# Patient Record
Sex: Female | Born: 1950 | Race: White | Hispanic: No | Marital: Single | Smoking: Never smoker
Health system: Southern US, Community
[De-identification: ages and names within clinical notes are randomized; demographics above are authoritative.]

## PROBLEM LIST (undated history)

## (undated) DIAGNOSIS — S42302A Unspecified fracture of shaft of humerus, left arm, initial encounter for closed fracture: Secondary | ICD-10-CM

## (undated) DIAGNOSIS — H547 Unspecified visual loss: Secondary | ICD-10-CM

## (undated) DIAGNOSIS — I10 Essential (primary) hypertension: Secondary | ICD-10-CM

## (undated) DIAGNOSIS — H269 Unspecified cataract: Secondary | ICD-10-CM

## (undated) DIAGNOSIS — E119 Type 2 diabetes mellitus without complications: Secondary | ICD-10-CM

## (undated) DIAGNOSIS — H409 Unspecified glaucoma: Secondary | ICD-10-CM

## (undated) DIAGNOSIS — Z9189 Other specified personal risk factors, not elsewhere classified: Secondary | ICD-10-CM

---
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
## 2013-01-24 ENCOUNTER — Emergency Department (HOSPITAL_COMMUNITY): Payer: Self-pay

## 2013-01-24 ENCOUNTER — Emergency Department (INDEPENDENT_AMBULATORY_CARE_PROVIDER_SITE_OTHER)
Admission: EM | Admit: 2013-01-24 | Discharge: 2013-01-24 | Disposition: A | Discharge status: Nursing Home (Includes ICF) | Payer: Self-pay | Source: Home / Self Care | Attending: Emergency Medicine | Admitting: Emergency Medicine

## 2013-01-24 ENCOUNTER — Emergency Department (HOSPITAL_COMMUNITY)
Admission: EM | Admit: 2013-01-24 | Discharge: 2013-01-25 | Disposition: A | Discharge status: Home / Self Care | Payer: Self-pay | Attending: Emergency Medicine | Admitting: Emergency Medicine

## 2013-01-24 ENCOUNTER — Encounter (HOSPITAL_COMMUNITY): Payer: Self-pay | Admitting: Emergency Medicine

## 2013-01-24 ENCOUNTER — Encounter (HOSPITAL_COMMUNITY): Payer: Self-pay | Admitting: *Deleted

## 2013-01-24 DIAGNOSIS — H5712 Ocular pain, left eye: Secondary | ICD-10-CM

## 2013-01-24 DIAGNOSIS — H5710 Ocular pain, unspecified eye: Secondary | ICD-10-CM

## 2013-01-24 DIAGNOSIS — I10 Essential (primary) hypertension: Secondary | ICD-10-CM | POA: Insufficient documentation

## 2013-01-24 DIAGNOSIS — R51 Headache: Secondary | ICD-10-CM

## 2013-01-24 DIAGNOSIS — R7309 Other abnormal glucose: Secondary | ICD-10-CM | POA: Insufficient documentation

## 2013-01-24 DIAGNOSIS — H547 Unspecified visual loss: Secondary | ICD-10-CM | POA: Insufficient documentation

## 2013-01-24 HISTORY — DX: Essential (primary) hypertension: I10

## 2013-01-24 HISTORY — DX: Type 2 diabetes mellitus without complications: E11.9

## 2013-01-24 LAB — DIFFERENTIAL
Basophils Absolute: 0 10*3/uL (ref 0.0–0.1)
Lymphocytes Relative: 16 % (ref 12–46)
Lymphs Abs: 1.2 10*3/uL (ref 0.7–4.0)
Monocytes Absolute: 0.7 10*3/uL (ref 0.1–1.0)
Neutro Abs: 5.5 10*3/uL (ref 1.7–7.7)

## 2013-01-24 LAB — POCT I-STAT, CHEM 8
BUN: 17 mg/dL (ref 6–23)
Calcium, Ion: 1.16 mmol/L (ref 1.13–1.30)
Creatinine, Ser: 1 mg/dL (ref 0.50–1.10)
Glucose, Bld: 283 mg/dL — ABNORMAL HIGH (ref 70–99)
TCO2: 25 mmol/L (ref 0–100)

## 2013-01-24 LAB — COMPREHENSIVE METABOLIC PANEL
ALT: 16 U/L (ref 0–35)
AST: 16 U/L (ref 0–37)
CO2: 23 mEq/L (ref 19–32)
Chloride: 99 mEq/L (ref 96–112)
GFR calc Af Amer: >90 mL/min (ref >90)
GFR calc non Af Amer: 88 mL/min — ABNORMAL LOW (ref >90)
Glucose, Bld: 285 mg/dL — ABNORMAL HIGH (ref 70–99)
Sodium: 134 mEq/L — ABNORMAL LOW (ref 135–145)
Total Bilirubin: 0.7 mg/dL (ref 0.3–1.2)

## 2013-01-24 LAB — CBC
HCT: 34.7 % — ABNORMAL LOW (ref 36.0–46.0)
MCV: 82.2 fL (ref 78.0–100.0)
Platelets: 307 10*3/uL (ref 150–400)
RBC: 4.22 MIL/uL (ref 3.87–5.11)
RDW: 13.1 % (ref 11.5–15.5)
WBC: 7.6 10*3/uL (ref 4.0–10.5)

## 2013-01-24 LAB — APTT: aPTT: 34 seconds (ref 24–37)

## 2013-01-24 LAB — POCT I-STAT TROPONIN I

## 2013-01-24 NOTE — ED Notes (Addendum)
C/o pain in L eye, like a severe migraine, onset with 8/16.  Pain constant and increasing intensity.  Taking Ibuprofen.  Took Percocet today with partial relief but got nauseated.  She thinks she had a sinus infection  1-2 weeks ago.  Lost vision 1 year ago with pain in her R eye. 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  L eye vision decreasing over the past week.  She sees light and shadows- no detail.  Had smell coming from L ear- smell on Q tip for over 1 year.  No medical insurance, so she has not seen a doctor for it.  C/o dizziness from riding in car.  2 weeks ago she has had a hacking cough occasionally productive.

## 2013-01-24 NOTE — ED Notes (Signed)
PT. 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 HYPERTENSIVE AT TRIAGE .

## 2013-01-24 NOTE — ED Notes (Signed)
TRIAGE NURSE UPDATED ( EDP ) DR. Ranae Palms ON PT'S STATUS / HYPERTENSION - ORDERED STROKE PANEL ON PT.

## 2013-01-24 NOTE — ED Provider Notes (Signed)
CSN: [id number]     Arrival date & time 01/24/13  1920 History   First MD Initiated Contact with Patient 01/24/13 1937     Chief Complaint  Patient presents with  . Eye Pain  . Loss of Vision   (Consider location/radiation/quality/duration/timing/severity/associated sxs/prior Treatment) HPI Comments: Patient presents this evening to urgent care complaining of having a severe left-sided retro-ocular migraine type headache. ( Patient does not have a history of migraines nor she has been seen by any neurologist she reports since her arrival from [place]),. 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 She's been taking ibuprofen at home for her headaches, this afternoon she had to take some, Percocet for her severe headache. Patient does not have a primary care Dr. and denies having had any extensive her previous workups including a CT scan of her head. Prior to her arrival from [place]. Patient reports seeing flashes of lights and shadows on her left thigh. At this point she is only able to see changes in light very poorly defined shapes   She comes in tonight describing worsening headache retro-ocular pain and changes or worsening of her vision of her left eye. She also reports dizziness.  Son reports that patient has been having respiratory symptoms for the last 2 weeks which includes cough that becomes occasional productive. She is not a smoker and has never been diagnosed with COPD or or emphysema.   Patient is a 62 y.o. female presenting with eye pain. The history is provided by the patient.  Eye Pain This is a chronic problem. Associated symptoms include headaches. Pertinent negatives include no chest pain and no shortness of breath. Nothing aggravates the symptoms. Nothing relieves the symptoms.    Past Medical History  Diagnosis Date  . Hypertension   . Diabetes mellitus without complication    prediabetic   Past Surgical History  Procedure Laterality Date  . Cesarean section  1987  . Tonsillectomy  1967   History reviewed. No pertinent family history. History  Substance Use Topics  . Smoking status: Never Smoker   . Smokeless tobacco: Not on file  . Alcohol Use: No   OB History   Grav Para Term Preterm Abortions TAB SAB Ect Mult Living                 Review of Systems  Constitutional: Negative for activity change and appetite change.  Eyes: Positive for pain and visual disturbance. Negative for discharge.  Respiratory: Negative for cough and shortness of breath.   Cardiovascular: Negative for chest pain.  Musculoskeletal: Negative for myalgias.  Skin: Negative for color change.  Neurological: Positive for dizziness and headaches. Negative for tremors, seizures, syncope, facial asymmetry, speech difficulty, weakness and light-headedness.    Allergies  Bufferin low dose; Iodine; Sulfites; and Neosporin original  Home Medications   Current Outpatient Rx  Name  Route  Sig  Dispense  Refill  . ibuprofen (ADVIL,MOTRIN) 200 MG tablet   Oral   Take 200 mg by mouth every 6 (six) hours as needed for pain.         Marland Kitchen oxyCODONE-acetaminophen (PERCOCET/ROXICET) 5-325 MG per tablet   Oral   Take 1 tablet by mouth once.          BP 125/79  Pulse 80  Temp(Src) 98.2 F (36.8 C) (Oral)  Resp 20  SpO2 92% Physical Exam  Nursing note  and vitals reviewed. Constitutional: She is oriented to person, place, and time. No distress.  HENT:  Mouth/Throat: No oropharyngeal exudate.  Eyes: Right pupil is reactive. Left pupil is reactive.  Fundoscopic exam:      The right eye shows hemorrhage. The right eye shows red reflex.       The left eye shows hemorrhage. The left eye shows red reflex.  Pulmonary/Chest: No respiratory distress. She has wheezes. She has rales. She exhibits no tenderness.  Neurological: She is alert and oriented to person, place, and time. No cranial  nerve deficit. She exhibits normal muscle tone. Coordination normal.  Skin: No erythema.    ED Course  Procedures (including critical care time) Labs Review Labs Reviewed - No data to display Imaging Review No results found.  MDM   1. Headache   2. Ocular pain, left      Multi-symptomatic patient, includes severe headache, left worsening visual deficit, suboptimal pulse oxygenation and respiratory symptoms.   Patient reports severe unilateral headache associated with further changes in her left vision. Of note patient has not had as she reports any workups for her previous loss of vision of her right eye. Attributes her unilateral left retro-ocular severe headache to "sinusitis"   At this point would have a patient with several complaints and symptomatology which most remarkable is symptomatic patient report of severe unilateral headache associated with loss of vision. At this point I cannot rule out an intra-cranial tumor, complications of cerebrovascular subacute advanced, retinal detachments or corpus vitreous bleeding etc.... Patient needs a more extensive workup in the emergency department as patient is reporting active unilateral severe headache  Patient has also been having respiratory symptoms and her pulse oxygenation is suboptimal.     Jimmie Molly, MD 01/24/13 2019

## 2013-01-25 MED ORDER — DIPHENHYDRAMINE HCL 50 MG/ML IJ SOLN
25.0000 mg | Freq: Once | INTRAMUSCULAR | Status: AC
  Administered 2013-01-25: 25 mg via INTRAVENOUS
  Filled 2013-01-25: qty 1

## 2013-01-25 MED ORDER — METFORMIN HCL 500 MG PO TABS: 500.0000 mg | ORAL_TABLET | Freq: Two times a day (BID) | ORAL | Status: DC

## 2013-01-25 MED ORDER — METOCLOPRAMIDE HCL 5 MG/ML IJ SOLN
10.0000 mg | Freq: Once | INTRAMUSCULAR | Status: AC
  Administered 2013-01-25: 10 mg via INTRAVENOUS
  Filled 2013-01-25: qty 2

## 2013-01-25 MED ORDER — METHYLPREDNISOLONE SODIUM SUCC 125 MG IJ SOLR
125.0000 mg | Freq: Once | INTRAMUSCULAR | Status: AC
  Administered 2013-01-25: 125 mg via INTRAVENOUS
  Filled 2013-01-25: qty 2

## 2013-01-25 MED ORDER — SODIUM CHLORIDE 0.9 % IV SOLN
Freq: Once | INTRAVENOUS | Status: AC
  Administered 2013-01-25: 01:00:00 via INTRAVENOUS

## 2013-01-25 MED ORDER — HYDROCHLOROTHIAZIDE 12.5 MG PO TABS: 12.5000 mg | ORAL_TABLET | Freq: Every day | ORAL | Status: DC

## 2013-01-25 MED ORDER — PREDNISONE 10 MG PO TABS: 40.0000 mg | ORAL_TABLET | Freq: Every day | ORAL | Status: AC

## 2013-01-25 NOTE — ED Provider Notes (Signed)
CSN: [id number]     Arrival date & time 01/24/13  2041 History   First MD Initiated Contact with Patient 01/24/13 2254     Chief Complaint  Patient presents with  . Headache   (Consider location/radiation/quality/duration/timing/severity/associated sxs/prior Treatment) HPI Patient presents with concern headache, visual loss. She is a poor historian.  However, it seems as though over the past week she has had persistent headache, periorbital, severe, throbbing on the left.  Concurrently she has had diminished visual capacity, with blurring, but without visual field loss. Patient has a notable history of uncontrolled hypertension, diabetes. Additionally, the patient had loss of vision in her right eye approximately one year ago after a similar period of his symptoms.  For this headache, she has had minimal relief with OTC medication. There is no concurrent nausea, vomiting, confusion, dislocation, fever, chills. She does complain of ongoing chest congestion, cough. Past Medical History  Diagnosis Date  . Hypertension   . Diabetes mellitus without complication     prediabetic   Past Surgical History  Procedure Laterality Date  . Cesarean section  1987  . Tonsillectomy  1967   No family history on file. History  Substance Use Topics  . Smoking status: Never Smoker   . Smokeless tobacco: Not on file  . Alcohol Use: No   OB History   Grav Para Term Preterm Abortions TAB SAB Ect Mult Living                 Review of Systems  All other systems reviewed and are negative.    Allergies  Bufferin low dose; Iodine; Sulfites; and Neosporin original  Home Medications   Current Outpatient Rx  Name  Route  Sig  Dispense  Refill  . Bioflavonoid Products (VITAMIN C) CHEW   Oral   Chew 1 tablet by mouth daily.         . Cyanocobalamin (VITAMIN B-12 SL)   Sublingual   Place 1 tablet under the tongue daily.         Marland Kitchen guaiFENesin-dextromethorphan (ROBITUSSIN DM) 100-10 MG/5ML  syrup   Oral   Take 10 mLs by mouth 3 (three) times daily as needed for cough.         . hydroxypropyl methylcellulose (ISOPTO TEARS) 2.5 % ophthalmic solution   Both Eyes   Place 1 drop into both eyes 4 (four) times daily as needed (allergies).         Marland Kitchen ibuprofen (ADVIL,MOTRIN) 200 MG tablet   Oral   Take 400 mg by mouth every 6 (six) hours as needed for pain.          . Multiple Vitamin (MULTIVITAMIN WITH MINERALS) TABS tablet   Oral   Take 1 tablet by mouth daily.         Marland Kitchen oxyCODONE-acetaminophen (PERCOCET/ROXICET) 5-325 MG per tablet   Oral   Take 1 tablet by mouth every 4 (four) hours as needed for pain (and headache).          BP 195/84  Pulse 80  Temp(Src) 97.8 F (36.6 C) (Oral)  Resp 24  SpO2 93% Physical Exam  Nursing note and vitals reviewed. Constitutional: She is oriented to person, place, and time. She appears well-developed and well-nourished. No distress.  HENT:  Head: Normocephalic and atraumatic.  Eyes: Conjunctivae and EOM are normal.  Both eyes are dilated, symmetrically.  The left eye is reactive to light, the right eye is not.  In the left funduscopic view there appears to  be vascular congestion.  No discharge from either eye, range of motion is appropriate with both eyes.  Neck: Full passive range of motion without pain. Neck supple. No spinous process tenderness and no muscular tenderness present. No rigidity. No edema present.  Cardiovascular: Normal rate and regular rhythm.   Pulmonary/Chest: Effort normal and breath sounds normal. No stridor. No respiratory distress.  Abdominal: She exhibits no distension.  Musculoskeletal: She exhibits no edema.  Neurological: She is alert and oriented to person, place, and time. She displays no atrophy. No cranial nerve deficit or sensory deficit. She exhibits normal muscle tone.  Skin: Skin is warm and dry.  Psychiatric: She has a normal mood and affect. Cognition and memory are impaired.    ED  Course  Procedures (including critical care time) Labs Review Labs Reviewed  CBC - Abnormal; Notable for the following:    HCT 34.7 (*)    All other components within normal limits  COMPREHENSIVE METABOLIC PANEL - Abnormal; Notable for the following:    Sodium 134 (*)    Glucose, Bld 285 (*)    Albumin 2.9 (*)    Alkaline Phosphatase 136 (*)    GFR calc non Af Amer 88 (*)    All other components within normal limits  GLUCOSE, CAPILLARY - Abnormal; Notable for the following:    Glucose-Capillary 267 (*)    All other components within normal limits  SEDIMENTATION RATE - Abnormal; Notable for the following:    Sed Rate 90 (*)    All other components within normal limits  POCT I-STAT, CHEM 8 - Abnormal; Notable for the following:    Glucose, Bld 283 (*)    All other components within normal limits  PROTIME-INR  APTT  DIFFERENTIAL  TROPONIN I  POCT I-STAT TROPONIN I   Imaging Review Ct Head (brain) Wo Contrast  01/24/2013   *RADIOLOGY REPORT*  Clinical Data: Headache, severe left-sided retro-ocular migraine  CT HEAD WITHOUT CONTRAST  Technique:  Contiguous axial images were obtained from the base of the skull through the vertex without contrast.  Comparison: None.  Findings:  Mild diffuse atrophy.  Scattered minimal periventricular hypodensities suggestive of microvascular ischemic disease.  Wallace Cullens white differentiation is otherwise well maintained without CT evidence of acute large territory infarct.  No intraparenchymal or extra-axial hemorrhage. No intraparenchymal mass.  There is a faint approximately 5 mm likely calcified presumably extra-axial structure adjacent to the left occipital lobe (image 12, series 2 and 3).  Normal size and configuration of the ventricles and basilar cisterns.  No midline shift. There is underpneumatization of the right frontal sinus.  Limited visualization of the paranasal sinuses is otherwise normal. The regional soft tissues are normal. No displaced  calvarial fracture.  IMPRESSION: 1.  Mild atrophy and microvascular ischemic disease without acute intracranial process.  2.  Sub centimeter possibly calcified, likely extra-axial structure adjacent to the left occipital lobe may represent a tiny meningioma.  Further evaluation without emergent brain MRI may be performed as clinically indicated.   Original Report Authenticated By: Tacey Ruiz, MD   Pulse ox symmetry 99% room abnormal   Update: I discussed the patient's case with both neurology and ophthalmology. MDM  No diagnosis found. Patient presents with a long history of uncontrolled diabetes and hypertension, now with headache, visual loss.  With your history, or some suspicion for diabetic retinopathy versus glaucoma, though central retinal artery occlusion and temporal arteritis are considerations.  With her history, labs, imaging performed.  Of note  and the patient elevated sedimentation rate.  This may be secondary to vascular/arteritis or ongoing inflammatory condition.  With her history of    chronic respiratory difficulty, there is some suspicion for the latter.  The patient received impaired steroids.  With the aforementioned concerns, I discussed her case with multiple specialists.  I arranged expeditious follow up in the morning with ophthalmology, and the patient will continue steroids.  Additionally, the patient was started on antihypertensive and antihyperglycemic medication with discharge following a lengthy discussion with her and the multiple family members on the necessity for follow up with multiple physicians for further evaluation and management.  The patient has visual loss, this is occurring over approximately one week, and appropriate followup was arranged.    Gerhard Munch, MD 01/25/13 [phone]

## 2014-04-21 ENCOUNTER — Ambulatory Visit: Payer: Self-pay | Admitting: Family Medicine

## 2014-07-14 IMAGING — CT CT HEAD W/O CM
1 series · 15 of 30 positions shown, 19 images · non-contrast
Comparison: None.

CLINICAL DATA: Headache, severe left-sided retro-ocular migraine

CT HEAD WITHOUT CONTRAST
TECHNIQUE: Contiguous axial images were obtained from the base of
the skull through the vertex without contrast.

[Series 2: head 5.0 h30s · axial · 0.45mm/px · z∈[-77,+58]mm · 15 of 31 slices shown, 19 images]
[im 2/31  brain]
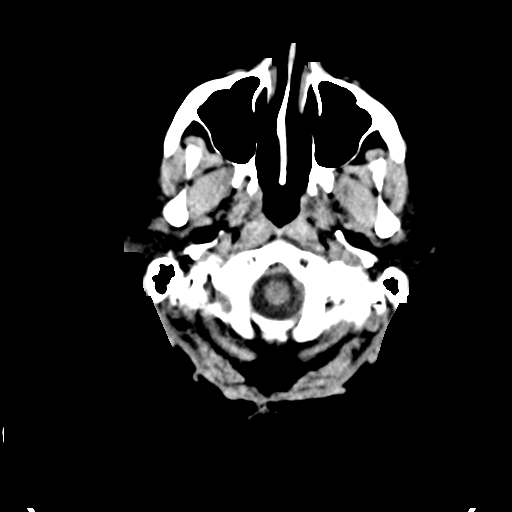
[im 2/31  bone]
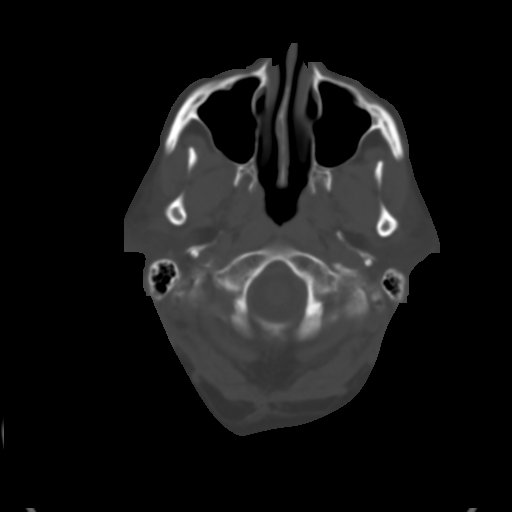
[im 4/31  brain]
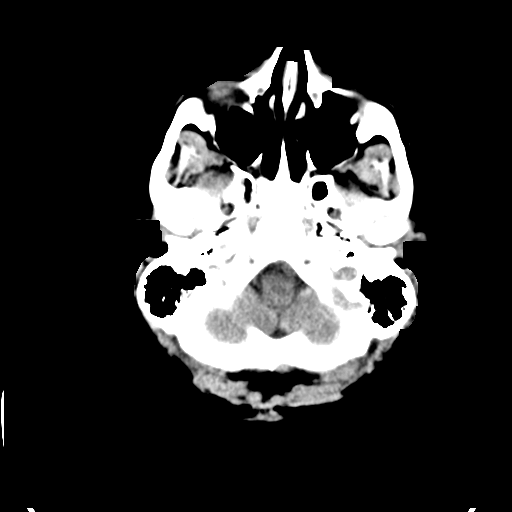
[im 6/31  brain]
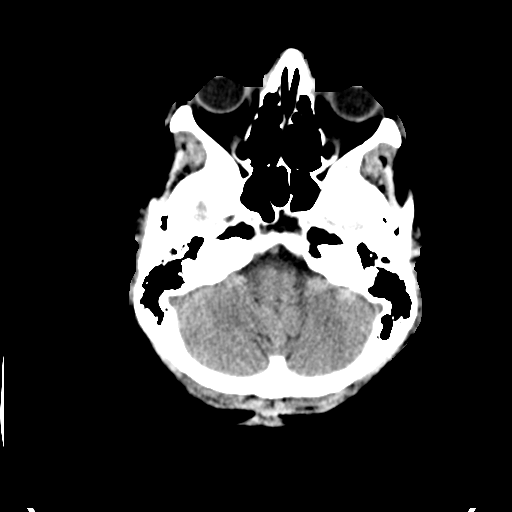
[im 8/31  brain]
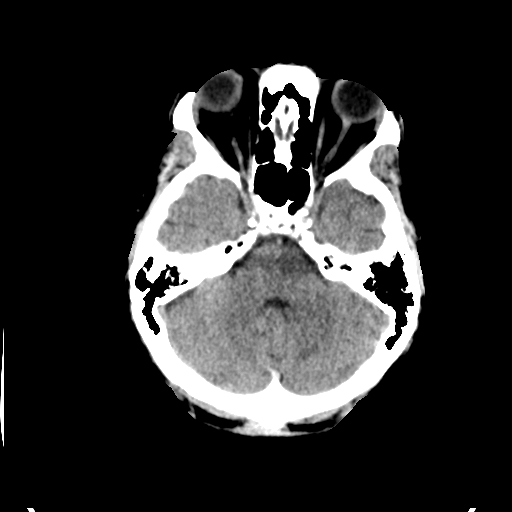
[im 10/31  brain]
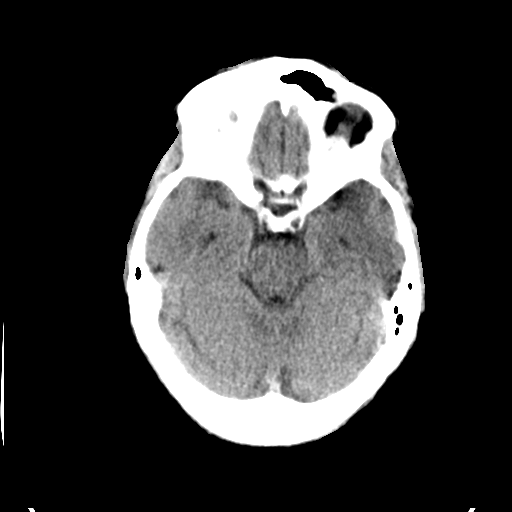
[im 10/31  bone]
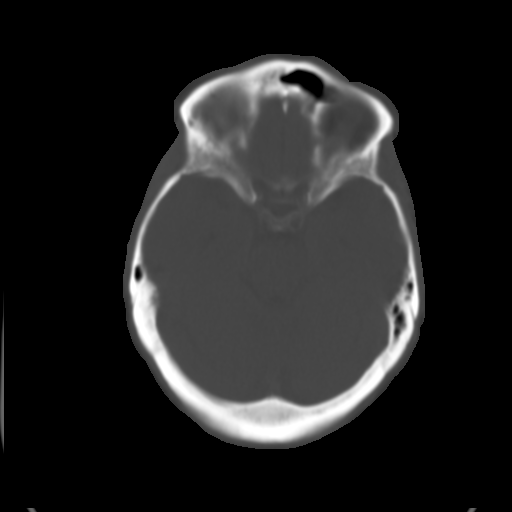
[im 12/31  brain]
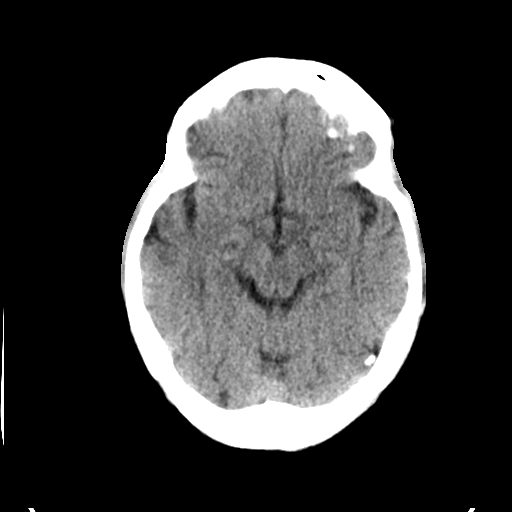
[im 14/31  brain]
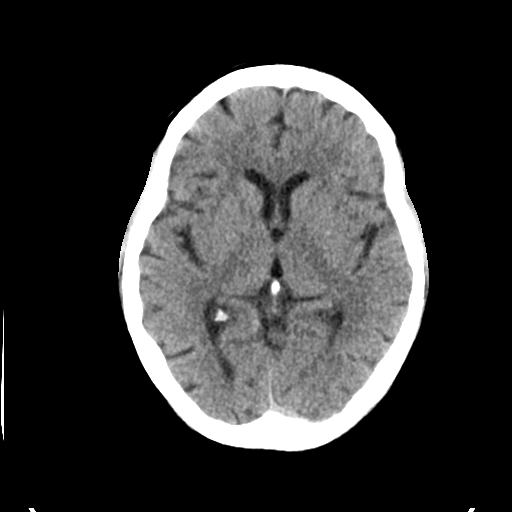
[im 16/31  brain]
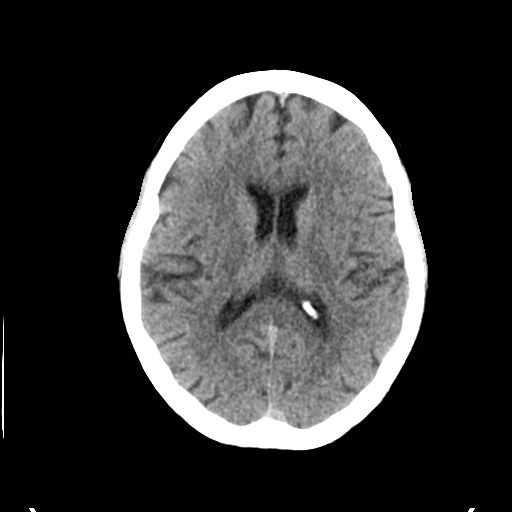
[im 17/31  brain]
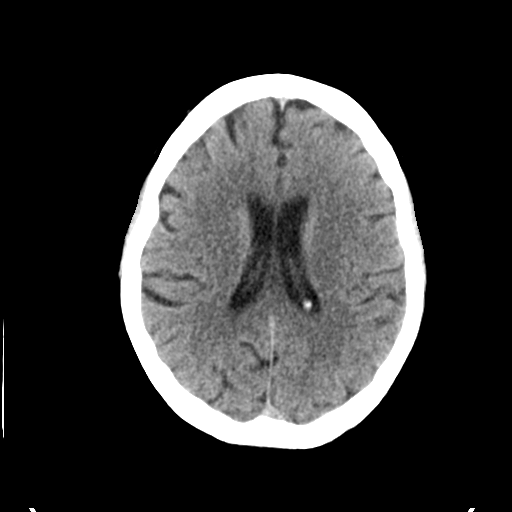
[im 17/31  bone]
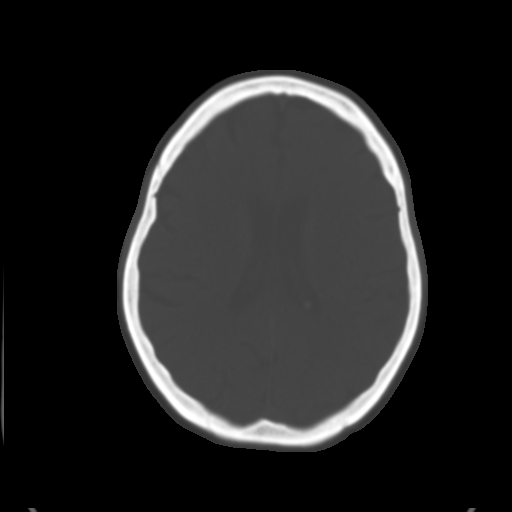
[im 19/31  brain]
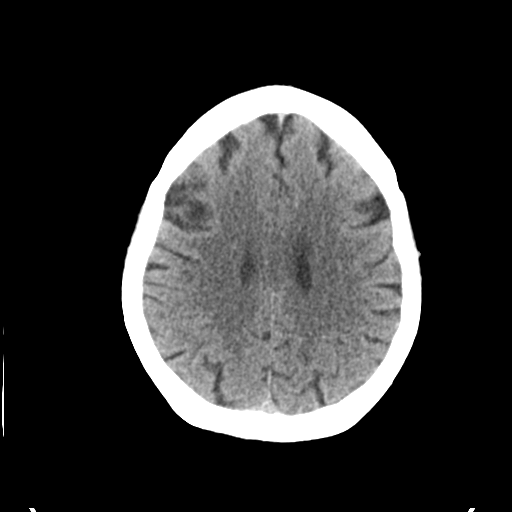
[im 21/31  brain]
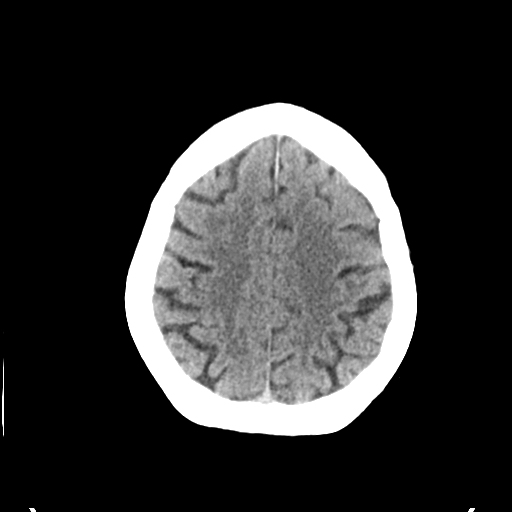
[im 23/31  brain]
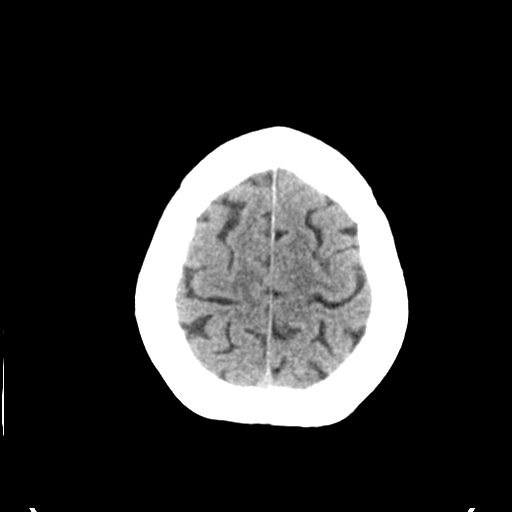
[im 25/31  brain]
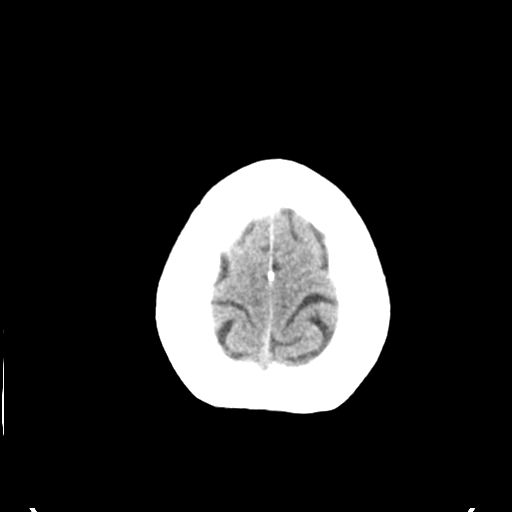
[im 25/31  bone]
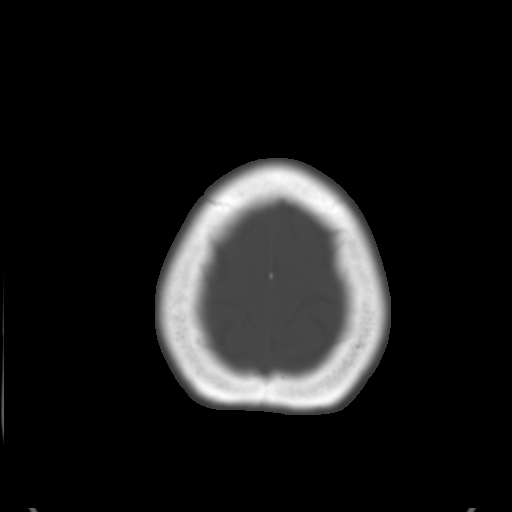
[im 27/31  brain]
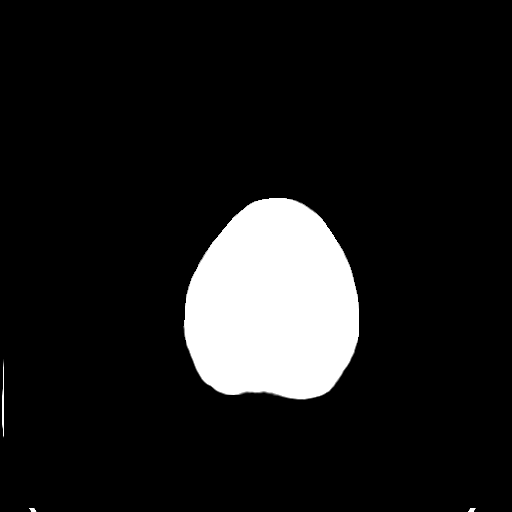
[im 29/31  brain]
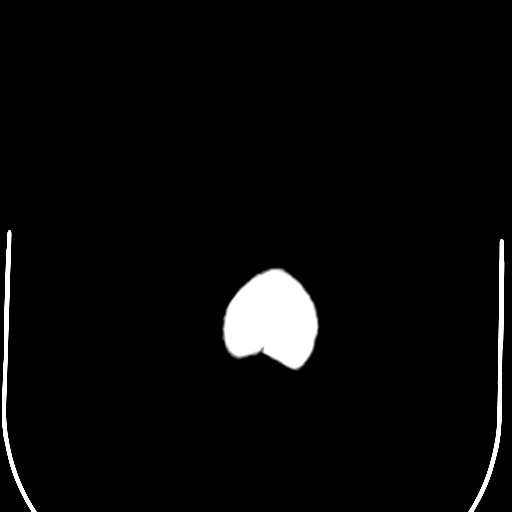

[15 of 30 positions shown; findings below may reference images not displayed]

FINDINGS: Mild diffuse atrophy.  Scattered minimal periventricular
hypodensities suggestive of microvascular ischemic disease.  Gray
white differentiation is otherwise well maintained without CT
evidence of acute large territory infarct.  No intraparenchymal or
extra-axial hemorrhage. No intraparenchymal mass.

There is a faint approximately 5 mm likely calcified presumably
extra-axial structure adjacent to the left occipital lobe (image
12, series 2 and 3).

Normal size and configuration of the ventricles and basilar
cisterns.  No midline shift. There is underpneumatization of the
right frontal sinus.  Limited visualization of the paranasal
sinuses is otherwise normal. The regional soft tissues are normal.
No displaced calvarial fracture.
IMPRESSION: 1.  Mild atrophy and microvascular ischemic disease without acute
intracranial process.

2.  Sub centimeter possibly calcified, likely extra-axial structure
adjacent to the left occipital lobe may represent a tiny
meningioma.  Further evaluation without emergent brain MRI may be
performed as clinically indicated.

## 2014-09-06 ENCOUNTER — Encounter (HOSPITAL_COMMUNITY): Payer: Self-pay | Admitting: Emergency Medicine

## 2014-09-06 ENCOUNTER — Emergency Department (HOSPITAL_COMMUNITY): Payer: Medicaid Other

## 2014-09-06 ENCOUNTER — Other Ambulatory Visit (HOSPITAL_COMMUNITY): Payer: Self-pay

## 2014-09-06 ENCOUNTER — Inpatient Hospital Stay (HOSPITAL_COMMUNITY)
Admission: EM | Admit: 2014-09-06 | Discharge: 2014-10-01 | DRG: 871 | Disposition: E | Payer: Medicaid Other | Attending: Internal Medicine | Admitting: Internal Medicine

## 2014-09-06 DIAGNOSIS — I5041 Acute combined systolic (congestive) and diastolic (congestive) heart failure: Secondary | ICD-10-CM | POA: Diagnosis not present

## 2014-09-06 DIAGNOSIS — I255 Ischemic cardiomyopathy: Secondary | ICD-10-CM | POA: Diagnosis present

## 2014-09-06 DIAGNOSIS — R739 Hyperglycemia, unspecified: Secondary | ICD-10-CM | POA: Diagnosis present

## 2014-09-06 DIAGNOSIS — I130 Hypertensive heart and chronic kidney disease with heart failure and stage 1 through stage 4 chronic kidney disease, or unspecified chronic kidney disease: Secondary | ICD-10-CM | POA: Diagnosis present

## 2014-09-06 DIAGNOSIS — R0602 Shortness of breath: Secondary | ICD-10-CM

## 2014-09-06 DIAGNOSIS — N179 Acute kidney failure, unspecified: Secondary | ICD-10-CM | POA: Diagnosis not present

## 2014-09-06 DIAGNOSIS — E872 Acidosis, unspecified: Secondary | ICD-10-CM | POA: Diagnosis present

## 2014-09-06 DIAGNOSIS — Z833 Family history of diabetes mellitus: Secondary | ICD-10-CM

## 2014-09-06 DIAGNOSIS — R570 Cardiogenic shock: Secondary | ICD-10-CM | POA: Diagnosis not present

## 2014-09-06 DIAGNOSIS — I214 Non-ST elevation (NSTEMI) myocardial infarction: Secondary | ICD-10-CM | POA: Diagnosis present

## 2014-09-06 DIAGNOSIS — H269 Unspecified cataract: Secondary | ICD-10-CM | POA: Diagnosis present

## 2014-09-06 DIAGNOSIS — I739 Peripheral vascular disease, unspecified: Secondary | ICD-10-CM | POA: Diagnosis present

## 2014-09-06 DIAGNOSIS — Z79899 Other long term (current) drug therapy: Secondary | ICD-10-CM | POA: Diagnosis not present

## 2014-09-06 DIAGNOSIS — Z8249 Family history of ischemic heart disease and other diseases of the circulatory system: Secondary | ICD-10-CM

## 2014-09-06 DIAGNOSIS — E1122 Type 2 diabetes mellitus with diabetic chronic kidney disease: Secondary | ICD-10-CM | POA: Diagnosis present

## 2014-09-06 DIAGNOSIS — M79661 Pain in right lower leg: Secondary | ICD-10-CM | POA: Diagnosis not present

## 2014-09-06 DIAGNOSIS — I252 Old myocardial infarction: Secondary | ICD-10-CM | POA: Diagnosis not present

## 2014-09-06 DIAGNOSIS — E1140 Type 2 diabetes mellitus with diabetic neuropathy, unspecified: Secondary | ICD-10-CM | POA: Diagnosis present

## 2014-09-06 DIAGNOSIS — R627 Adult failure to thrive: Secondary | ICD-10-CM | POA: Diagnosis present

## 2014-09-06 DIAGNOSIS — H540 Blindness, both eyes: Secondary | ICD-10-CM | POA: Diagnosis present

## 2014-09-06 DIAGNOSIS — Z452 Encounter for adjustment and management of vascular access device: Secondary | ICD-10-CM

## 2014-09-06 DIAGNOSIS — E8779 Other fluid overload: Secondary | ICD-10-CM

## 2014-09-06 DIAGNOSIS — R238 Other skin changes: Secondary | ICD-10-CM | POA: Diagnosis present

## 2014-09-06 DIAGNOSIS — R778 Other specified abnormalities of plasma proteins: Secondary | ICD-10-CM | POA: Diagnosis present

## 2014-09-06 DIAGNOSIS — E871 Hypo-osmolality and hyponatremia: Secondary | ICD-10-CM | POA: Diagnosis present

## 2014-09-06 DIAGNOSIS — R05 Cough: Secondary | ICD-10-CM | POA: Diagnosis not present

## 2014-09-06 DIAGNOSIS — R748 Abnormal levels of other serum enzymes: Secondary | ICD-10-CM | POA: Diagnosis present

## 2014-09-06 DIAGNOSIS — N189 Chronic kidney disease, unspecified: Secondary | ICD-10-CM | POA: Diagnosis not present

## 2014-09-06 DIAGNOSIS — E662 Morbid (severe) obesity with alveolar hypoventilation: Secondary | ICD-10-CM | POA: Diagnosis present

## 2014-09-06 DIAGNOSIS — I10 Essential (primary) hypertension: Secondary | ICD-10-CM

## 2014-09-06 DIAGNOSIS — Z515 Encounter for palliative care: Secondary | ICD-10-CM | POA: Diagnosis not present

## 2014-09-06 DIAGNOSIS — R059 Cough, unspecified: Secondary | ICD-10-CM | POA: Insufficient documentation

## 2014-09-06 DIAGNOSIS — E46 Unspecified protein-calorie malnutrition: Secondary | ICD-10-CM | POA: Diagnosis present

## 2014-09-06 DIAGNOSIS — R740 Nonspecific elevation of levels of transaminase and lactic acid dehydrogenase [LDH]: Secondary | ICD-10-CM | POA: Diagnosis not present

## 2014-09-06 DIAGNOSIS — J189 Pneumonia, unspecified organism: Secondary | ICD-10-CM | POA: Diagnosis present

## 2014-09-06 DIAGNOSIS — R4182 Altered mental status, unspecified: Secondary | ICD-10-CM | POA: Insufficient documentation

## 2014-09-06 DIAGNOSIS — R0600 Dyspnea, unspecified: Secondary | ICD-10-CM | POA: Insufficient documentation

## 2014-09-06 DIAGNOSIS — A419 Sepsis, unspecified organism: Principal | ICD-10-CM | POA: Diagnosis present

## 2014-09-06 DIAGNOSIS — I472 Ventricular tachycardia: Secondary | ICD-10-CM | POA: Diagnosis present

## 2014-09-06 DIAGNOSIS — E1121 Type 2 diabetes mellitus with diabetic nephropathy: Secondary | ICD-10-CM | POA: Diagnosis present

## 2014-09-06 DIAGNOSIS — E8770 Fluid overload, unspecified: Secondary | ICD-10-CM

## 2014-09-06 DIAGNOSIS — N184 Chronic kidney disease, stage 4 (severe): Secondary | ICD-10-CM | POA: Diagnosis present

## 2014-09-06 DIAGNOSIS — R7309 Other abnormal glucose: Secondary | ICD-10-CM

## 2014-09-06 DIAGNOSIS — R7989 Other specified abnormal findings of blood chemistry: Secondary | ICD-10-CM | POA: Diagnosis not present

## 2014-09-06 DIAGNOSIS — L909 Atrophic disorder of skin, unspecified: Secondary | ICD-10-CM

## 2014-09-06 DIAGNOSIS — Z66 Do not resuscitate: Secondary | ICD-10-CM | POA: Diagnosis present

## 2014-09-06 DIAGNOSIS — H409 Unspecified glaucoma: Secondary | ICD-10-CM | POA: Diagnosis present

## 2014-09-06 DIAGNOSIS — Z6841 Body Mass Index (BMI) 40.0 and over, adult: Secondary | ICD-10-CM | POA: Diagnosis not present

## 2014-09-06 DIAGNOSIS — E11319 Type 2 diabetes mellitus with unspecified diabetic retinopathy without macular edema: Secondary | ICD-10-CM | POA: Diagnosis present

## 2014-09-06 DIAGNOSIS — I2699 Other pulmonary embolism without acute cor pulmonale: Secondary | ICD-10-CM | POA: Diagnosis present

## 2014-09-06 DIAGNOSIS — J9601 Acute respiratory failure with hypoxia: Secondary | ICD-10-CM | POA: Insufficient documentation

## 2014-09-06 DIAGNOSIS — M79662 Pain in left lower leg: Secondary | ICD-10-CM | POA: Diagnosis not present

## 2014-09-06 DIAGNOSIS — E1165 Type 2 diabetes mellitus with hyperglycemia: Secondary | ICD-10-CM | POA: Diagnosis present

## 2014-09-06 DIAGNOSIS — H547 Unspecified visual loss: Secondary | ICD-10-CM

## 2014-09-06 DIAGNOSIS — I82411 Acute embolism and thrombosis of right femoral vein: Secondary | ICD-10-CM | POA: Diagnosis present

## 2014-09-06 HISTORY — DX: Other specified personal risk factors, not elsewhere classified: Z91.89

## 2014-09-06 HISTORY — DX: Unspecified fracture of shaft of humerus, left arm, initial encounter for closed fracture: S42.302A

## 2014-09-06 HISTORY — DX: Unspecified cataract: H26.9

## 2014-09-06 HISTORY — DX: Unspecified visual loss: H54.7

## 2014-09-06 HISTORY — DX: Unspecified glaucoma: H40.9

## 2014-09-06 LAB — I-STAT ARTERIAL BLOOD GAS, ED
ACID-BASE DEFICIT: 5 mmol/L — AB (ref 0.0–2.0)
Bicarbonate: 19.1 mEq/L — ABNORMAL LOW (ref 20.0–24.0)
O2 Saturation: 99 %
PCO2 ART: 31.3 mmHg — AB (ref 35.0–45.0)
PH ART: 7.391 (ref 7.350–7.450)
PO2 ART: 159 mmHg — AB (ref 80.0–100.0)
Patient temperature: 97.7
TCO2: 20 mmol/L (ref 0–100)

## 2014-09-06 LAB — CBC
HEMATOCRIT: 47 % — AB (ref 36.0–46.0)
Hemoglobin: 15.9 g/dL — ABNORMAL HIGH (ref 12.0–15.0)
MCH: 28.7 pg (ref 26.0–34.0)
MCHC: 33.8 g/dL (ref 30.0–36.0)
MCV: 84.8 fL (ref 78.0–100.0)
Platelets: 249 10*3/uL (ref 150–400)
RBC: 5.54 MIL/uL — AB (ref 3.87–5.11)
RDW: 13.6 % (ref 11.5–15.5)
WBC: 20.6 10*3/uL — AB (ref 4.0–10.5)

## 2014-09-06 LAB — PROTIME-INR
INR: 1.19 (ref 0.00–1.49)
Prothrombin Time: 15.2 seconds (ref 11.6–15.2)

## 2014-09-06 LAB — I-STAT CG4 LACTIC ACID, ED: LACTIC ACID, VENOUS: 3.46 mmol/L — AB (ref 0.5–2.0)

## 2014-09-06 LAB — CBG MONITORING, ED: Glucose-Capillary: 281 mg/dL — ABNORMAL HIGH (ref 70–99)

## 2014-09-06 LAB — DIFFERENTIAL
Basophils Absolute: 0 10*3/uL (ref 0.0–0.1)
Basophils Relative: 0 % (ref 0–1)
EOS PCT: 0 % (ref 0–5)
Eosinophils Absolute: 0 10*3/uL (ref 0.0–0.7)
LYMPHS ABS: 1.1 10*3/uL (ref 0.7–4.0)
LYMPHS PCT: 6 % — AB (ref 12–46)
MONO ABS: 1.4 10*3/uL — AB (ref 0.1–1.0)
Monocytes Relative: 7 % (ref 3–12)
NEUTROS ABS: 17.3 10*3/uL — AB (ref 1.7–7.7)
Neutrophils Relative %: 87 % — ABNORMAL HIGH (ref 43–77)

## 2014-09-06 LAB — COMPREHENSIVE METABOLIC PANEL
ALK PHOS: 139 U/L — AB (ref 39–117)
ALT: 28 U/L (ref 0–35)
ANION GAP: 7 (ref 5–15)
AST: 33 U/L (ref 0–37)
Albumin: 2.5 g/dL — ABNORMAL LOW (ref 3.5–5.2)
BILIRUBIN TOTAL: 1.3 mg/dL — AB (ref 0.3–1.2)
BUN: 44 mg/dL — AB (ref 6–23)
CHLORIDE: 97 mmol/L (ref 96–112)
CO2: 28 mmol/L (ref 19–32)
Calcium: 8.5 mg/dL (ref 8.4–10.5)
Creatinine, Ser: 2.19 mg/dL — ABNORMAL HIGH (ref 0.50–1.10)
GFR, EST AFRICAN AMERICAN: 26 mL/min — AB (ref >90)
GFR, EST NON AFRICAN AMERICAN: 23 mL/min — AB (ref >90)
GLUCOSE: 321 mg/dL — AB (ref 70–99)
POTASSIUM: 4.9 mmol/L (ref 3.5–5.1)
Sodium: 132 mmol/L — ABNORMAL LOW (ref 135–145)
Total Protein: 5.3 g/dL — ABNORMAL LOW (ref 6.0–8.3)

## 2014-09-06 LAB — I-STAT TROPONIN, ED: TROPONIN I, POC: 3.06 ng/mL — AB (ref 0.00–0.08)

## 2014-09-06 LAB — D-DIMER, QUANTITATIVE (NOT AT ARMC): D DIMER QUANT: 1.22 ug{FEU}/mL — AB (ref 0.00–0.48)

## 2014-09-06 LAB — BRAIN NATRIURETIC PEPTIDE: B NATRIURETIC PEPTIDE 5: 3107.9 pg/mL — AB (ref 0.0–100.0)

## 2014-09-06 MED ORDER — DEXTROSE 5 % IV SOLN
1.0000 g | Freq: Once | INTRAVENOUS | Status: AC
  Administered 2014-09-06: 1 g via INTRAVENOUS
  Filled 2014-09-06: qty 10

## 2014-09-06 MED ORDER — DEXTROSE 5 % IV SOLN
500.0000 mg | Freq: Every day | INTRAVENOUS | Status: DC
  Administered 2014-09-07: 500 mg via INTRAVENOUS
  Filled 2014-09-06 (×3): qty 500

## 2014-09-06 MED ORDER — SODIUM CHLORIDE 0.9 % IV SOLN
Freq: Once | INTRAVENOUS | Status: AC
  Administered 2014-09-06: 21:00:00 via INTRAVENOUS

## 2014-09-06 MED ORDER — HEPARIN (PORCINE) IN NACL 100-0.45 UNIT/ML-% IJ SOLN
1250.0000 [IU]/h | INTRAMUSCULAR | Status: DC
  Administered 2014-09-06 – 2014-09-07 (×2): 1100 [IU]/h via INTRAVENOUS
  Filled 2014-09-06 (×4): qty 250

## 2014-09-06 MED ORDER — SODIUM CHLORIDE 0.9 % IJ SOLN
3.0000 mL | Freq: Two times a day (BID) | INTRAMUSCULAR | Status: DC
  Administered 2014-09-07 – 2014-09-12 (×11): 3 mL via INTRAVENOUS

## 2014-09-06 MED ORDER — INSULIN ASPART 100 UNIT/ML ~~LOC~~ SOLN
0.0000 [IU] | Freq: Every day | SUBCUTANEOUS | Status: DC
  Administered 2014-09-07: 4 [IU] via SUBCUTANEOUS
  Administered 2014-09-11: 2 [IU] via SUBCUTANEOUS

## 2014-09-06 MED ORDER — HEPARIN BOLUS VIA INFUSION
4000.0000 [IU] | Freq: Once | INTRAVENOUS | Status: AC
  Administered 2014-09-06: 4000 [IU] via INTRAVENOUS
  Filled 2014-09-06: qty 4000

## 2014-09-06 MED ORDER — SODIUM CHLORIDE 0.9 % IV BOLUS (SEPSIS): 1000.0000 mL | Freq: Once | INTRAVENOUS | Status: DC

## 2014-09-06 MED ORDER — SODIUM CHLORIDE 0.9 % IV SOLN
INTRAVENOUS | Status: DC
  Administered 2014-09-07: via INTRAVENOUS

## 2014-09-06 MED ORDER — FENTANYL CITRATE 0.05 MG/ML IJ SOLN
50.0000 ug | Freq: Once | INTRAMUSCULAR | Status: AC
  Administered 2014-09-06: 50 ug via INTRAVENOUS
  Filled 2014-09-06: qty 2

## 2014-09-06 MED ORDER — IPRATROPIUM-ALBUTEROL 0.5-2.5 (3) MG/3ML IN SOLN
3.0000 mL | RESPIRATORY_TRACT | Status: DC
  Administered 2014-09-07 (×2): 3 mL via RESPIRATORY_TRACT
  Filled 2014-09-06 (×3): qty 3

## 2014-09-06 MED ORDER — INSULIN ASPART 100 UNIT/ML ~~LOC~~ SOLN
0.0000 [IU] | Freq: Three times a day (TID) | SUBCUTANEOUS | Status: DC
  Administered 2014-09-07: 7 [IU] via SUBCUTANEOUS
  Administered 2014-09-08 – 2014-09-09 (×3): 4 [IU] via SUBCUTANEOUS

## 2014-09-06 MED ORDER — AZITHROMYCIN 500 MG IV SOLR
500.0000 mg | Freq: Once | INTRAVENOUS | Status: AC
  Administered 2014-09-06: 500 mg via INTRAVENOUS
  Filled 2014-09-06: qty 500

## 2014-09-06 MED ORDER — CLOPIDOGREL BISULFATE 75 MG PO TABS
150.0000 mg | ORAL_TABLET | Freq: Once | ORAL | Status: DC
  Filled 2014-09-06: qty 2

## 2014-09-06 NOTE — ED Notes (Signed)
Elevated troponin of 3.06 reported to Dr. Patria Maneampos

## 2014-09-06 NOTE — H&P (Signed)
Date: 09/20/2014               Patient Name:  Tina Atkins MRN: [id number]  DOB: 01-06-1951 Age / Sex: 64 y.o., female   PCP: Pcp Not In System         Medical Service: Internal Medicine Teaching Service         Attending Physician: Dr. Aldine Contes, MD    First Contact: Dr. Karle Starch Moding Pager: [phone]  Second Contact: Dr. Michail Jewels Pager: [phone]       After Hours (After 5p/  First Contact Pager: [phone]  weekends / holidays): Second Contact Pager: [phone]   Chief Complaint: Non-productive cough, shortness of breath and retaining water for 1.5 weeks  History of Present Illness: Tina Atkins is a 64 yo woman with a history of hypertension, prediabetes and total blindess who presented to [facility] with a 1.5 week history of non-productive cough, shortness of breath and water retention. She first attributed her cough to a cold, as she had a sick contact. However, she soon noticed shortness of breath that was worse with activity; she found herself unable to walk 3 steps before becoming short of breath. She also noticed weeping and blistering on her legs. She has typically been able to spend at least some nights sleeping flat in bed, but noticed that she had to sleep sitting up this week. She also has been experiencing subjective fever, chills and one episode of diarrhea. She has had a decreased appetite, but has been drinking fluids. She denies any chest pain, chest pressure, hemoptysis, dysuria, nausea or vomiting. She tried treating her symptoms with guafenisen, dayquil and a small amount of ibuprofen, but got no relief.   The patient has become quite deconditioned, and has not actually been strong enough to leave her house for the past 4 months. This is a major decline for her. She has also been lost to medical care for some time. She apparently had a PCP appointment set up, but it fell through due to some difficulties with Medicaid.   Meds:  Current Outpatient  Prescriptions  Medication Sig Dispense Refill  . b complex vitamins tablet Take 1 tablet by mouth 2 (two) times a week.    Marland Kitchen guaiFENesin (MUCINEX) 600 MG 12 hr tablet Take 600 mg by mouth 2 (two) times daily as needed.    Marland Kitchen ibuprofen (ADVIL,MOTRIN) 200 MG tablet Take 400 mg by mouth every 6 (six) hours as needed for moderate pain.    . naproxen sodium (ANAPROX) 220 MG tablet Take 220 mg by mouth 2 (two) times daily as needed (FOR PAIN).    Marland Kitchen PE-DM-APAP & Doxylamin-DM-APAP (LIQUID) MISC Take 2 capsules by mouth 2 (two) times daily as needed (FOR COLD).      Allergies: Allergies as of 09/21/2014 - Review Complete 09/24/2014  Allergen Reaction Noted  . Bufferin low dose [aspirin] Anaphylaxis 01/24/2013  . Iodine Nausea And Vomiting 01/24/2013  . Neosporin original [bacitracin-neomycin-polymyxin] Rash 01/24/2013  . Sulfites Other (See Comments) 01/24/2013   Past Medical History  Diagnosis Date  . Marland Kitchen Hypertension Blindness (glaucoma? "something with the optic nerve". Does not remember when she lost her right eye vision, but lost left eye vision in 01/2013).   . Diabetes mellitus without complication     prediabetic   Past Surgical History  Procedure Laterality Date  . Cesarean section  1987  . Tonsillectomy  1967   No family history on file. History   Social History  .  Marital Status: Divorced,     Spouse Name: N/A  . Number of Children: 2  . Years of Education: N/A   Occupational History  . Not on file.   Social History Main Topics  . Smoking status: Never Smoker   . Smokeless tobacco: Not on file  . Alcohol Use: No  . Drug Use: Not on file  . Sexual Activity: Not on file   Other Topics Concern  . Not on file   Social History Narrative    Review of Systems: See HPI  Physical Exam: Blood pressure 113/69, pulse 83, temperature 97.7 F (36.5 C), temperature source Oral, resp. rate 20, height 5' 1"  (1.549 m), weight 180 lb (81.647 kg), SpO2 100 %. Appearance: lying  in bed at 30 degrees in some distress, occasionally moaning from left leg pain, 2L O2 [place] in place HEENT: AT/[place], no light perception OU, 8 mm pupils OU unresponsive to light, completely opacified OS, EOMi, no orbital edema Heart: distant heart sounds, no murmurs Lungs: loud inspiratory and expiratory wheezes on anterior exam Abdomen: BS+, pitting edema present throughout abdomen, soft, nontender Musculoskeletal: normal range of motion Extremities: both lower extremities have open wounds and full blisters, tender to touch throughout, difficult to assess for calf tenderness, some are weeping, 2-3+ pitting edema throughout lower extremities Neurologic: A&Ox3, grossly intact Skin: leg wounds as above  Lab results: Basic Metabolic Panel:  Recent Labs  09/11/2014 1942  NA 132*  K 4.9  CL 97  CO2 28  GLUCOSE 321*  BUN 44*  CREATININE 2.19*  CALCIUM 8.5   Liver Function Tests:  Recent Labs  09/11/2014 1942  AST 33  ALT 28  ALKPHOS 139*  BILITOT 1.3*  PROT 5.3*  ALBUMIN 2.5*   GGT: 117 (H)  CBC:  Recent Labs  09/02/2014 1942  WBC 20.6*  NEUTROABS 17.3*  HGB 15.9*  HCT 47.0*  MCV 84.8  PLT 249   Troponin: 5.18  Lactic acid 3.46-->2.8  ABG: pH 7.391, pCO2 31.3(L), pO2 159.0(H), bicarbonate 18.1(L)  D-Dimer:  Recent Labs  09/04/2014 2020  DDIMER 1.22*   CBG:  Recent Labs  09/30/2014 1947  GLUCAP 281*   Coagulation:  Recent Labs  09/23/2014 1942  LABPROT 15.2  INR 1.19   Imaging results:  Dg Chest Portable 1 View  09/23/2014   CLINICAL DATA:  Shortness of breath. Dry cough for 1 week. Congestion.  EXAM: PORTABLE CHEST - 1 VIEW  COMPARISON:  None.  FINDINGS: Indistinct obscuration left hemidiaphragm, probably from a left pleural effusion with passive atelectasis, although left lower lobe pneumonia is not excluded. The right lung appears clear. Mild enlargement of the cardiopericardial silhouette. Thoracic spondylosis.  IMPRESSION: 1. Hazy obscuration left  hemidiaphragm -differential diagnostic considerations include left lower lobe atelectasis, left lower lobe pneumonia, or a layering pleural effusion. 2. Mild enlargement of the cardiopericardial silhouette.   Electronically Signed   By: Van Clines M.D.   On: 09/10/2014 20:42    Other results: EKG 3/6: rate 96, normal sinus rhythm, low voltage, TWI v6, III, avf  Assessment & Plan by Problem: Principal Problem:   Shortness of breath Active Problems:   Hyponatremia   AKI (acute kidney injury)   Elevated troponin   Malnutrition   Elevated alkaline phosphatase level   Lactic acid acidosis   Cough   SOB (shortness of breath)   Dyspnea   Hyperglycemia   Lower extremity edema   Fluid overload   Skin breakdown   Demand ischemia  NSTEMI: Elevated troponin 5.18 without chest pain. EKG shows TWI in v6, III, avf.  - Appreciate cardiology consult; may want to take her to cath once more stable - Continue heparin drip (considered switch to lovenox, but will continue with heparin given kidney function) - 2D Echocardiogram pending  Cough, Shortness of Breath, Volume Overload: BNP (3107.9), d-dimer (1.22), WBC (20.6), creatinine (2.19). She does not have any chest pain but has impressive signs of volume overload (lower extremity weeping edema, abdominal ascites, dyspnea, paroxysmal nocturnal dyspnea). Patient is unsure of baseline weight; believes she was ~160 lbs for most of her life (currently 216 lbs). CXR concerning for LLL atelectasis, pneumonia or layering pleural effusion. While she most likely has cardiorenal syndrome, the differential remains broad including heart failure, pneumonia, pulmonary embolism (Geneva score for PE is 3; Well's score for DVT is 0). She has become sedentary since she lost her vision about 1 year ago. Her d-dimer correlates with troponin leak, so less likely to be PE.  - 2D echocardiogram pending - Lasix 40 mg IV once; will watch response  - Discontinued IVF -  Strict Is&Os - Percocet 5-325 mg given once for leg pain - Tylenol PRN for leg pain - Wound care consult - Lower extremity doppler b/l - Heparin drip was started over concern for PE in the ED; continued for ACS - Continue azithromycin and ceftriaxone - Urine legionella antigen and strep pneumo antigen pending - Continue Mucinex - 2-view CXR in am - Urine culture pending - Sputum culture pending - Duonebs - Flu pending - UDS pending  Non-Anion Gap Lactic Acidosis: Stable vital signs. Lactic acid 3.46-->2.8. ABG showed pH 7.391, pCO2 31.3(L), pO2 159.0(H), bicarbonate 18.1(L). This is in the setting of one episode of diarrhea. Also in the context of hyponatremia. Could be secondary to her renal or heart failure. - Trending lactic acid  Hypervolemic Hyponatremia: Sodium 132. Likely due to heart failure; she also may have contributing kidney failure or pneumonia. She also admits to drinking a lot of water without eating much over the past few weeks.  - Continue to trend sodium  Acute Kidney Injury: Baseline creatinine 1.0 in 12/2012-->2.19 on admission. - Trend creatinine; expect a bump with lasix - Urine creatinine, urine sodium, urine osms, osmolality pending  Elevated Alkaline Phosphatase: 139 (similar to 12/2012 alk phos 136). GGT elevated at 117, pointing to hepatic cause.  Prediabetes: Unclear if diabetes has ever truly been diagnosed (patient says she was prediabetic, then lost weight, but has since become sedentary). Admission glucose 321. - Hemoglobin A1c - SSI resistant - CBG 4 times daily, AC, HS  Hypertension: Currently 113/69. Not on home anti-hypertensives. Unclear if this is a true diagnosis.  Loss of Vision: By report, patient has seen ophthalmology in the past, but never had invasive therapy. She believes she might have had drops for increased pressures. 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   Difficulty at Home: Patient has struggled  to manage at home by herself; finds herself relying on her daughter quite often. Knows that daughter Mickel Baas) has a full time job to which she commutes, and realizes she needs more help. Patient is blind. - Consult to SW  Diet: NPO  DVT Ppx: on heparin drip  Code Status: DNR/DNI status was confirmed with patient on night of admission  Dispo: Disposition is deferred at this time, awaiting improvement of current medical problems. Anticipated discharge in approximately 2-3 day(s). Patient is amenable to going  to a nursing or rehab facility, but is worried about whether her Medicaid will cover it.    The patient does not have a current PCP (Pcp Not In System) and does not know need an [facility] hospital follow-up appointment after discharge.  The patient does not have transportation limitations that hinder transportation to clinic appointments.  Signed: Karlene Einstein, MD 09/01/2014, 10:23 PM

## 2014-09-06 NOTE — ED Notes (Signed)
Pt is from home, pt reports a cough and increased SOB that has been ongoing for about a week. Pt reports her cough is nonproductive. Pt also reports LLE swelling that has been on-going but has gotten worse over the past week with weeping as well.

## 2014-09-06 NOTE — ED Notes (Signed)
MD at bedside. 

## 2014-09-06 NOTE — Progress Notes (Addendum)
Patient transferred to 3 east by stretcher from emergency department.  Pain mainly in left leg. 4+ pitting edema, weeping and blisters.  Attempted orthostatic vitals and made it to sitting position but patient could not stand do to weakness and pain in lower legs.    Vital signs were stable. 92% on room air but will place on 2L nasal cannula to bring up to 98%. Dyspnea with exertion.

## 2014-09-06 NOTE — Progress Notes (Addendum)
ANTIBIOTIC CONSULT NOTE - INITIAL  Pharmacy Consult for Ceftriaxone Indication: CAP  Allergies  Allergen Reactions  . Bufferin Low Dose [Aspirin] Anaphylaxis    Swelling in face and cough, laryngospasm  . Iodine Nausea And Vomiting    Crab meat  . Neosporin Original [Bacitracin-Neomycin-Polymyxin] Rash    Bacitracin- makes her skin red and rashy  . Sulfites Other (See Comments)    Turns her face purple and feels hot    Patient Measurements: Height: 5\' 1"  (154.9 cm) Weight: 216 lb (97.977 kg) (bed scale) IBW/kg (Calculated) : 47.8 Adjusted Body Weight: 67.9 kg BMI = 40.9   Vital Signs: Temp: 98 F (36.7 C) (04/06 2344) Temp Source: Oral (04/06 2344) BP: 109/86 mmHg (04/06 2344) Pulse Rate: 93 (04/06 2344) Intake/Output from previous day:   Intake/Output from this shift: Total I/O In: 600 [I.V.:300; IV Piggyback:300] Out: -   Labs:  Recent Labs  10/19/14 1942  WBC 20.6*  HGB 15.9*  PLT 249  CREATININE 2.19*   Estimated Creatinine Clearance: 27.8 mL/min (by C-G formula based on Cr of 2.19). No results for input(s): VANCOTROUGH, VANCOPEAK, VANCORANDOM, GENTTROUGH, GENTPEAK, GENTRANDOM, TOBRATROUGH, TOBRAPEAK, TOBRARND, AMIKACINPEAK, AMIKACINTROU, AMIKACIN in the last 72 hours.   Microbiology: No results found for this or any previous visit (from the past 720 hour(s)).  Medical History: Past Medical History  Diagnosis Date  . Hypertension   . Diabetes mellitus without complication     prediabetic    Medications:  Scheduled:  . [START ON 09/07/2014] azithromycin  500 mg Intravenous QHS  . [START ON 09/07/2014] insulin aspart  0-20 Units Subcutaneous TID WC  . insulin aspart  0-5 Units Subcutaneous QHS  . ipratropium-albuterol  3 mL Nebulization Q4H  . sodium chloride  3 mL Intravenous Q12H   Assessment: 64 y.o obese female with PMH of DM and HTN presented to hospital with SOB, cough and bilateral lower extremity swelling, presumed to be a PE.  Started on IV  heparin per pharmacy protocol. Now pharmacy consulted to dose ceftriaxone IV for possible CAP.  She received 1st dose of ceftriaxone and azithromycin in ED on 4/6 PM.  Goal of Therapy:  Appropriate antibiotic dosing. Eradication of infection  Plan:  Ceftriaxone 2 gm IV q24h, okay to give next dose ~ 10AM. No adjustment necessary for any changes in renal function for ceftriaxone.   Noah Delaineuth Dangela How, RPh Clinical Pharmacist Pager: [phone] 09/24/2014,11:58 PM

## 2014-09-06 NOTE — ED Provider Notes (Signed)
CSN: [id number]     Arrival date & time 09/08/2014  1924 History   First MD Initiated Contact with Patient 09-08-14 1928     Chief Complaint  Patient presents with  . Shortness of Breath  . Cough     (Consider location/radiation/quality/duration/timing/severity/associated sxs/prior Treatment) HPI   This is a 64 year old female past medical history of hypertension, diabetes, who comes in with approximate week and half of cough which has been nonproductive associated shortness of breath and she also been having bilateral lower extremity swelling which she says has been weeping for the last week. She denies any chest pain. No history of coronary artery disease. No previous DVT PE. No history of cancer or recent immobilization.  Past Medical History  Diagnosis Date  . Hypertension   . Diabetes mellitus without complication     prediabetic   Past Surgical History  Procedure Laterality Date  . Cesarean section  1987  . Tonsillectomy  1967   No family history on file. History  Substance Use Topics  . Smoking status: Never Smoker   . Smokeless tobacco: Not on file  . Alcohol Use: No   OB History    No data available     Review of Systems  Constitutional: Negative for fever and chills.  HENT: Negative for nosebleeds.   Eyes: Negative for visual disturbance.  Respiratory: Positive for cough and shortness of breath.   Cardiovascular: Positive for leg swelling. Negative for chest pain.  Gastrointestinal: Negative for nausea, vomiting, abdominal pain, diarrhea and constipation.  Genitourinary: Negative for dysuria.  Skin: Negative for rash.  Neurological: Negative for weakness.  All other systems reviewed and are negative.     Allergies  Bufferin low dose; Iodine; Sulfites; and Neosporin original  Home Medications   Prior to Admission medications   Medication Sig Start Date End Date Taking? Authorizing Provider  Bioflavonoid Products (VITAMIN C) CHEW Chew 1 tablet by mouth  daily.    Historical Provider, MD  Cyanocobalamin (VITAMIN B-12 SL) Place 1 tablet under the tongue daily.    Historical Provider, MD  guaiFENesin-dextromethorphan (ROBITUSSIN DM) 100-10 MG/5ML syrup Take 10 mLs by mouth 3 (three) times daily as needed for cough.    Historical Provider, MD  hydrochlorothiazide (HYDRODIURIL) 12.5 MG tablet Take 1 tablet (12.5 mg total) by mouth daily. 01/25/13   Gerhard Munch, MD  hydroxypropyl methylcellulose (ISOPTO TEARS) 2.5 % ophthalmic solution Place 1 drop into both eyes 4 (four) times daily as needed (allergies).    Historical Provider, MD  ibuprofen (ADVIL,MOTRIN) 200 MG tablet Take 400 mg by mouth every 6 (six) hours as needed for pain.     Historical Provider, MD  metFORMIN (GLUCOPHAGE) 500 MG tablet Take 1 tablet (500 mg total) by mouth 2 (two) times daily. 01/25/13   Gerhard Munch, MD  Multiple Vitamin (MULTIVITAMIN WITH MINERALS) TABS tablet Take 1 tablet by mouth daily.    Historical Provider, MD  oxyCODONE-acetaminophen (PERCOCET/ROXICET) 5-325 MG per tablet Take 1 tablet by mouth every 4 (four) hours as needed for pain (and headache).    Historical Provider, MD   BP 127/72 mmHg  Pulse 92  Temp(Src) 97.7 F (36.5 C) (Oral)  Resp 24  Ht  (1.549 m)  SpO2 100% Physical Exam  Constitutional: She is oriented to person, place, and time. No distress.  HENT:  Head: Normocephalic and atraumatic.  Eyes: EOM are normal. Pupils are equal, round, and reactive to light.  Neck: Normal range of motion. Neck supple.  Cardiovascular: Normal rate and intact distal pulses.   Pulmonary/Chest: No respiratory distress.  rhonchi  Abdominal: Soft. There is no tenderness.  Musculoskeletal: Normal range of motion. She exhibits edema (bilat LE swelling with sporadic flacid bullae).  Neurological: She is alert and oriented to person, place, and time.  Skin: She is not diaphoretic.  Psychiatric: She has a normal mood and affect.    ED Course  Procedures  (including critical care time) Labs Review Labs Reviewed  CBC - Abnormal; Notable for the following:    WBC 20.6 (*)    RBC 5.54 (*)    Hemoglobin 15.9 (*)    HCT 47.0 (*)    All other components within normal limits  I-STAT TROPOININ, ED - Abnormal; Notable for the following:    Troponin i, poc 3.06 (*)    All other components within normal limits  CBG MONITORING, ED - Abnormal; Notable for the following:    Glucose-Capillary 281 (*)    All other components within normal limits  COMPREHENSIVE METABOLIC PANEL  BRAIN NATRIURETIC PEPTIDE  DIFFERENTIAL  I-STAT CG4 LACTIC ACID, ED    Imaging Review No results found.   EKG Interpretation None      MDM   Final diagnoses:  None    This is a 64 year old female past medical history of hypertension, diabetes, who comes in with approximate week and half of cough which has been nonproductive associated shortness of breath and bilateral LE edema  Exam as above, there is bilateral LE edema with some flacid bullae.  She is AF.  satting in the low 90's on RA.  tachypneic in teh 20's.  Will obtain cbc/bmp/trop/lactic acid/cxr/ekg  ekg w/o ischemic changes.  Trop 3.  Lactic elevated.  CXR w/ possible LLL infiltrate vs. Atelectasis.  WBC 20, BNP sig elevated.  Ddx: PNA, ACS, PE, HF.  Feel PNA is less likely given no fever, non-productive cough.  Will cover empirically now though.  bcx sent. High suspicion for PE given elevated trop and BNP in this patient w/ no hx of CAD or heat failure and low sats.  Unfortunately, patient has mild AKI making it so that we can't obtained a contrasted study.  Will start heparin gtt.   Doubt ACS given no chest pain and the slowly progressive sx.  Will admit to family medicine.  Spoke with cards attn who suggested ruling out PE prior to formal cards consult.  Patient stable in the ED, will admit to medicine.    Silas FloodErik Sylvan Sookdeo, MD 09/07/14 [id number]  Azalia BilisKevin Campos, MD 09/07/14 [phone]

## 2014-09-06 NOTE — Progress Notes (Signed)
ANTICOAGULATION CONSULT NOTE - Initial Consult  Pharmacy Consult for heparin Indication: pulmonary embolism  Allergies  Allergen Reactions  . Bufferin Low Dose [Aspirin] Anaphylaxis    Swelling in face and cough, laryngospasm  . Iodine Nausea And Vomiting    Crab meat  . Sulfites     Turns her face purple and feels hot  . Neosporin Original [Bacitracin-Neomycin-Polymyxin] Rash    Bacitracin- makes her skin red and rashy    Patient Measurements: Height: 5\' 1"  (154.9 cm) Weight: 180 lb (81.647 kg) IBW/kg (Calculated) : 47.8 Heparin Dosing Weight: 66.3 kg  Vital Signs: Temp: 97.7 F (36.5 C) (04/06 1943) Temp Source: Oral (04/06 1943) BP: 127/72 mmHg (04/06 2015) Pulse Rate: 92 (04/06 2000)  Labs:  Recent Labs  Nov 23, 2014 1942  HGB 15.9*  HCT 47.0*  PLT 249  CREATININE 2.19*    Estimated Creatinine Clearance: 25.1 mL/min (by C-G formula based on Cr of 2.19).   Medical History: Past Medical History  Diagnosis Date  . Hypertension   . Diabetes mellitus without complication     prediabetic    Medications:   (Not in a hospital admission)  Assessment: [age] yoF who presents with SOB, cough, and bilateral lower extremity swelling, presumed to be a PE. No anticoagulation PTA. Pharmacy was consulted to dose heparin. Trop 3.06, BNP 3107, SCr 2.19 (CrCl ~25-30 ml/min), H/H and platelets wnl.  Goal of Therapy:  Heparin level 0.3-0.7 units/ml Monitor platelets by anticoagulation protocol: Yes   Plan:  Heparin 4000 unit IV bolus, then 1100 unit/hr 8 hour HL (0500) Daily HL and CBC Monitor for signs of bleeding  Conception ChancyShah, Cristi Gwynn D 09/02/2014,8:29 PM

## 2014-09-07 ENCOUNTER — Encounter (HOSPITAL_COMMUNITY): Payer: Self-pay | Admitting: Internal Medicine

## 2014-09-07 ENCOUNTER — Inpatient Hospital Stay (HOSPITAL_COMMUNITY): Payer: Medicaid Other

## 2014-09-07 DIAGNOSIS — R6521 Severe sepsis with septic shock: Secondary | ICD-10-CM

## 2014-09-07 DIAGNOSIS — E46 Unspecified protein-calorie malnutrition: Secondary | ICD-10-CM

## 2014-09-07 DIAGNOSIS — L909 Atrophic disorder of skin, unspecified: Secondary | ICD-10-CM

## 2014-09-07 DIAGNOSIS — I214 Non-ST elevation (NSTEMI) myocardial infarction: Secondary | ICD-10-CM | POA: Diagnosis present

## 2014-09-07 DIAGNOSIS — E8770 Fluid overload, unspecified: Secondary | ICD-10-CM | POA: Insufficient documentation

## 2014-09-07 DIAGNOSIS — J189 Pneumonia, unspecified organism: Secondary | ICD-10-CM

## 2014-09-07 DIAGNOSIS — N189 Chronic kidney disease, unspecified: Secondary | ICD-10-CM

## 2014-09-07 DIAGNOSIS — H540 Blindness, both eyes: Secondary | ICD-10-CM

## 2014-09-07 DIAGNOSIS — R0602 Shortness of breath: Secondary | ICD-10-CM

## 2014-09-07 DIAGNOSIS — R600 Localized edema: Secondary | ICD-10-CM | POA: Insufficient documentation

## 2014-09-07 DIAGNOSIS — R238 Other skin changes: Secondary | ICD-10-CM | POA: Diagnosis present

## 2014-09-07 DIAGNOSIS — I5041 Acute combined systolic (congestive) and diastolic (congestive) heart failure: Secondary | ICD-10-CM | POA: Diagnosis present

## 2014-09-07 LAB — GLUCOSE, CAPILLARY
GLUCOSE-CAPILLARY: 311 mg/dL — AB (ref 70–99)
GLUCOSE-CAPILLARY: 97 mg/dL (ref 70–99)
Glucose-Capillary: 250 mg/dL — ABNORMAL HIGH (ref 70–99)
Glucose-Capillary: 209 mg/dL — ABNORMAL HIGH (ref 70–99)
Glucose-Capillary: 110 mg/dL — ABNORMAL HIGH (ref 70–99)
Glucose-Capillary: 151 mg/dL — ABNORMAL HIGH (ref 70–99)

## 2014-09-07 LAB — URINALYSIS, ROUTINE W REFLEX MICROSCOPIC
Glucose, UA: 500 mg/dL — AB
KETONES UR: 15 mg/dL — AB
NITRITE: NEGATIVE
PH: 5 (ref 5.0–8.0)
Protein, ur: >300 mg/dL — AB
Specific Gravity, Urine: 1.028 (ref 1.005–1.030)
Urobilinogen, UA: 1 mg/dL (ref 0.0–1.0)

## 2014-09-07 LAB — RAPID URINE DRUG SCREEN, HOSP PERFORMED
Amphetamines: NOT DETECTED
BARBITURATES: NOT DETECTED
Benzodiazepines: NOT DETECTED
Cocaine: NOT DETECTED
Opiates: NOT DETECTED
Tetrahydrocannabinol: NOT DETECTED

## 2014-09-07 LAB — CBC WITH DIFFERENTIAL/PLATELET
Basophils Absolute: 0 10*3/uL (ref 0.0–0.1)
Basophils Relative: 0 % (ref 0–1)
EOS ABS: 0 10*3/uL (ref 0.0–0.7)
Eosinophils Relative: 0 % (ref 0–5)
HCT: 40.8 % (ref 36.0–46.0)
Hemoglobin: 13.6 g/dL (ref 12.0–15.0)
Lymphocytes Relative: 5 % — ABNORMAL LOW (ref 12–46)
Lymphs Abs: 0.9 10*3/uL (ref 0.7–4.0)
MCH: 28.3 pg (ref 26.0–34.0)
MCHC: 33.3 g/dL (ref 30.0–36.0)
MCV: 84.8 fL (ref 78.0–100.0)
MONOS PCT: 7 % (ref 3–12)
Monocytes Absolute: 1.1 10*3/uL — ABNORMAL HIGH (ref 0.1–1.0)
NEUTROS ABS: 14.5 10*3/uL — AB (ref 1.7–7.7)
Neutrophils Relative %: 88 % — ABNORMAL HIGH (ref 43–77)
Platelets: 234 10*3/uL (ref 150–400)
RBC: 4.81 MIL/uL (ref 3.87–5.11)
RDW: 13.7 % (ref 11.5–15.5)
WBC: 16.5 10*3/uL — ABNORMAL HIGH (ref 4.0–10.5)

## 2014-09-07 LAB — TROPONIN I
Troponin I: 5.18 ng/mL (ref <0.031)
Troponin I: 5.76 ng/mL (ref <0.031)

## 2014-09-07 LAB — SODIUM, URINE, RANDOM

## 2014-09-07 LAB — INFLUENZA PANEL BY PCR (TYPE A & B)
H1N1 flu by pcr: NOT DETECTED
INFLBPCR: NEGATIVE
Influenza A By PCR: NEGATIVE

## 2014-09-07 LAB — MAGNESIUM: Magnesium: 2.1 mg/dL (ref 1.5–2.5)

## 2014-09-07 LAB — OSMOLALITY: Osmolality: 292 mOsm/kg (ref 275–300)

## 2014-09-07 LAB — HEPARIN LEVEL (UNFRACTIONATED)
HEPARIN UNFRACTIONATED: 0.47 [IU]/mL (ref 0.30–0.70)
HEPARIN UNFRACTIONATED: 0.28 [IU]/mL — AB (ref 0.30–0.70)
Heparin Unfractionated: 0.41 IU/mL (ref 0.30–0.70)

## 2014-09-07 LAB — LACTIC ACID, PLASMA: Lactic Acid, Venous: 2.8 mmol/L (ref 0.5–2.0)

## 2014-09-07 LAB — URINE MICROSCOPIC-ADD ON

## 2014-09-07 LAB — BASIC METABOLIC PANEL
ANION GAP: 10 (ref 5–15)
BUN: 46 mg/dL — ABNORMAL HIGH (ref 6–23)
CHLORIDE: 101 mmol/L (ref 96–112)
CO2: 20 mmol/L (ref 19–32)
CREATININE: 2.2 mg/dL — AB (ref 0.50–1.10)
Calcium: 8 mg/dL — ABNORMAL LOW (ref 8.4–10.5)
GFR calc non Af Amer: 22 mL/min — ABNORMAL LOW (ref >90)
GFR, EST AFRICAN AMERICAN: 26 mL/min — AB (ref >90)
Glucose, Bld: 280 mg/dL — ABNORMAL HIGH (ref 70–99)
Potassium: 4.5 mmol/L (ref 3.5–5.1)
Sodium: 131 mmol/L — ABNORMAL LOW (ref 135–145)

## 2014-09-07 LAB — HIV ANTIBODY (ROUTINE TESTING W REFLEX): HIV Screen 4th Generation wRfx: NONREACTIVE

## 2014-09-07 LAB — OSMOLALITY, URINE: OSMOLALITY UR: 485 mosm/kg (ref 390–1090)

## 2014-09-07 LAB — STREP PNEUMONIAE URINARY ANTIGEN: STREP PNEUMO URINARY ANTIGEN: POSITIVE — AB

## 2014-09-07 LAB — GAMMA GT: GGT: 117 U/L — ABNORMAL HIGH (ref 7–51)

## 2014-09-07 LAB — MRSA PCR SCREENING: MRSA by PCR: NEGATIVE

## 2014-09-07 LAB — PROCALCITONIN: Procalcitonin: 0.47 ng/mL

## 2014-09-07 LAB — CREATININE, URINE, RANDOM: Creatinine, Urine: 247.8 mg/dL

## 2014-09-07 MED ORDER — FUROSEMIDE 10 MG/ML IJ SOLN: 20.0000 mg | Freq: Once | INTRAMUSCULAR | Status: DC

## 2014-09-07 MED ORDER — DM-GUAIFENESIN ER 30-600 MG PO TB12
1.0000 | ORAL_TABLET | Freq: Two times a day (BID) | ORAL | Status: DC | PRN
  Administered 2014-09-08 – 2014-09-13 (×3): 1 via ORAL
  Filled 2014-09-07 (×5): qty 1

## 2014-09-07 MED ORDER — PERFLUTREN LIPID MICROSPHERE
1.0000 mL | INTRAVENOUS | Status: AC | PRN
  Administered 2014-09-07: 4 mL via INTRAVENOUS
  Filled 2014-09-07: qty 10

## 2014-09-07 MED ORDER — FUROSEMIDE 10 MG/ML IJ SOLN
60.0000 mg | Freq: Once | INTRAMUSCULAR | Status: AC
  Administered 2014-09-07: 60 mg via INTRAVENOUS
  Filled 2014-09-07: qty 6

## 2014-09-07 MED ORDER — HYDROCODONE-ACETAMINOPHEN 5-325 MG PO TABS
1.0000 | ORAL_TABLET | Freq: Four times a day (QID) | ORAL | Status: DC | PRN
  Administered 2014-09-07: 1 via ORAL
  Filled 2014-09-07: qty 1

## 2014-09-07 MED ORDER — GLUCERNA SHAKE PO LIQD
237.0000 mL | Freq: Two times a day (BID) | ORAL | Status: DC
  Administered 2014-09-07 – 2014-09-12 (×9): 237 mL via ORAL
  Filled 2014-09-07: qty 237

## 2014-09-07 MED ORDER — ALBUTEROL SULFATE (2.5 MG/3ML) 0.083% IN NEBU: 2.5000 mg | INHALATION_SOLUTION | RESPIRATORY_TRACT | Status: DC | PRN

## 2014-09-07 MED ORDER — CLOPIDOGREL BISULFATE 75 MG PO TABS
75.0000 mg | ORAL_TABLET | Freq: Every day | ORAL | Status: DC
Start: 2014-09-07 — End: 2014-09-13
  Administered 2014-09-07 – 2014-09-13 (×7): 75 mg via ORAL
  Filled 2014-09-07 (×8): qty 1

## 2014-09-07 MED ORDER — ADULT MULTIVITAMIN W/MINERALS CH
1.0000 | ORAL_TABLET | Freq: Every day | ORAL | Status: DC
  Administered 2014-09-07 – 2014-09-13 (×7): 1 via ORAL
  Filled 2014-09-07 (×7): qty 1

## 2014-09-07 MED ORDER — ACETAMINOPHEN 325 MG PO TABS
650.0000 mg | ORAL_TABLET | Freq: Four times a day (QID) | ORAL | Status: DC | PRN
Start: 2014-09-07 — End: 2014-09-07
  Administered 2014-09-07 (×2): 650 mg via ORAL
  Filled 2014-09-07 (×2): qty 2

## 2014-09-07 MED ORDER — OXYCODONE-ACETAMINOPHEN 5-325 MG PO TABS
1.0000 | ORAL_TABLET | Freq: Once | ORAL | Status: AC
  Administered 2014-09-07: 1 via ORAL
  Filled 2014-09-07: qty 1

## 2014-09-07 MED ORDER — INSULIN ASPART 100 UNIT/ML ~~LOC~~ SOLN
12.0000 [IU] | Freq: Once | SUBCUTANEOUS | Status: AC
  Administered 2014-09-07: 5 [IU] via SUBCUTANEOUS

## 2014-09-07 MED ORDER — HYDROCODONE-ACETAMINOPHEN 5-325 MG PO TABS
1.0000 | ORAL_TABLET | ORAL | Status: DC | PRN
  Administered 2014-09-07 – 2014-09-11 (×8): 1 via ORAL
  Filled 2014-09-07 (×10): qty 1

## 2014-09-07 MED ORDER — FUROSEMIDE 10 MG/ML IJ SOLN
80.0000 mg | Freq: Three times a day (TID) | INTRAMUSCULAR | Status: DC
  Administered 2014-09-07: 80 mg via INTRAVENOUS
  Filled 2014-09-07 (×4): qty 8

## 2014-09-07 MED ORDER — INSULIN DETEMIR 100 UNIT/ML ~~LOC~~ SOLN
10.0000 [IU] | Freq: Every day | SUBCUTANEOUS | Status: DC
  Administered 2014-09-07 – 2014-09-10 (×4): 10 [IU] via SUBCUTANEOUS
  Filled 2014-09-07 (×6): qty 0.1

## 2014-09-07 MED ORDER — MILRINONE IN DEXTROSE 20 MG/100ML IV SOLN
0.1250 ug/kg/min | INTRAVENOUS | Status: DC
  Administered 2014-09-07 – 2014-09-08 (×2): 0.125 ug/kg/min via INTRAVENOUS
  Filled 2014-09-07 (×2): qty 100

## 2014-09-07 MED ORDER — FUROSEMIDE 10 MG/ML IJ SOLN
40.0000 mg | Freq: Once | INTRAMUSCULAR | Status: AC
  Administered 2014-09-07: 40 mg via INTRAVENOUS
  Filled 2014-09-07: qty 4

## 2014-09-07 MED ORDER — IPRATROPIUM-ALBUTEROL 0.5-2.5 (3) MG/3ML IN SOLN
3.0000 mL | Freq: Two times a day (BID) | RESPIRATORY_TRACT | Status: DC
  Administered 2014-09-07 – 2014-09-12 (×11): 3 mL via RESPIRATORY_TRACT
  Filled 2014-09-07 (×11): qty 3

## 2014-09-07 MED ORDER — CEFTRIAXONE SODIUM IN DEXTROSE 40 MG/ML IV SOLN
2.0000 g | INTRAVENOUS | Status: DC
  Administered 2014-09-07: 2 g via INTRAVENOUS
  Filled 2014-09-07 (×2): qty 50

## 2014-09-07 MED ORDER — PRO-STAT SUGAR FREE PO LIQD
30.0000 mL | Freq: Two times a day (BID) | ORAL | Status: DC
  Administered 2014-09-07 – 2014-09-13 (×10): 30 mL via ORAL
  Filled 2014-09-07 (×15): qty 30

## 2014-09-07 NOTE — Consult Note (Signed)
CARDIOLOGY CONSULT NOTE  Patient ID: Tina Atkins MRN: [id number] DOB/AGE: [birth year] 64 y.o.  Admit date: 09/26/14 Referring Physician  [facility] Primary Physician:  Pcp Not In System Reason for Consultation  CHF, + troponin  HPI: Tina Atkins is a 64 year old caucasian female with a past medical history of HTN and blindness due to glaucoma and cataracts who presented to the ED on September 26, 2014 with progressive SOB and lower extremity edema. She states her physical health has been declining over the past 2 years due to deconditioning after vision loss. Over the past 2-3 months, she has noticed increased difficulty performing ADLs and routine chores due to dyspnea.  She recently had a URI about 2 weeks ago and has noticed a significant increase in SOB and has developed lower extremity edema since.  This past week, she began having leg pain and weeping and her daughter noticed she was developing open wounds and blisters on her legs. She has been unable to sleep lying flat for the past 3-4 weeks due to orthopnea. She also reports symptoms suggestive of PND although she has attributed them to anxiety.  Non-productive cough has been present for 2 weeks. She denies any chest pain.  She states she has been told in the past she was pre-diabetic but has not been followed or reevaluated for this recently.  Blood sugar markedly elevated in the 300s on admission.    While in room with pt, nurse notified of run of V-tach.  Monitor strip reviewed and revealed 10 beat run of NSVT which was asymptomatic.    Past Medical History  Diagnosis Date  . Hypertension   . Diabetes mellitus without complication     prediabetic  . Blind     both eyes  . 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 Fracture of left humerus   . Glaucoma   . Cataract      Past Surgical History  Procedure Laterality Date  . Cesarean section  1987  . Tonsillectomy  1967     Family History  Problem  Relation Age of Onset  . Heart disease Father   . Diabetes Father      Social History: History   Social History  . Marital Status: Single    Spouse Name: N/A  . Number of Children: N/A  . Years of Education: N/A   Occupational History  . Not on file.   Social History Main Topics  . Smoking status: Never Smoker   . Smokeless tobacco: Not on file  . Alcohol Use: No  . Drug Use: Not on file  . Sexual Activity: Not on file   Other Topics Concern  . Not on file   Social History Narrative   1 daughter (locally), 1 son (lives in [place])   Lives alone, blind   Divorced    Unemployed    Moved here from [place]   Never had mammogram/colonoscopy and does not want either           Prescriptions prior to admission  Medication Sig Dispense Refill Last Dose  . b complex vitamins tablet Take 1 tablet by mouth 2 (two) times a week.   09/26/2014 at Unknown time  . guaiFENesin (MUCINEX) 600 MG 12 hr tablet Take 600 mg by mouth 2 (two) times daily as needed.   Past Week at Unknown time  . ibuprofen (ADVIL,MOTRIN) 200 MG tablet Take 400 mg by mouth every 6 (six)  hours as needed for moderate pain.   09/25/2014 at Unknown time  . naproxen sodium (ANAPROX) 220 MG tablet Take 220 mg by mouth 2 (two) times daily as needed (FOR PAIN).   09/05/2014 at Unknown time  . PE-DM-APAP & Doxylamin-DM-APAP (LIQUID) MISC Take 2 capsules by mouth 2 (two) times daily as needed (FOR COLD).   Past Week at Unknown time    Scheduled Meds: . azithromycin  500 mg Intravenous QHS  . cefTRIAXone (ROCEPHIN)  IV  2 g Intravenous Q24H  . insulin aspart  0-20 Units Subcutaneous TID WC  . insulin aspart  0-5 Units Subcutaneous QHS  . ipratropium-albuterol  3 mL Nebulization Q4H  . sodium chloride  3 mL Intravenous Q12H   Continuous Infusions: . heparin 1,100 Units/hr (06-Aug-2014 2046)   PRN Meds:.acetaminophen, dextromethorphan-guaiFENesin  ROS: General: reports fatigue and weight gain, no fevers/chills/night  sweats Eyes: blindness ENT: no sore throat or hearing loss Resp: reports non-productive cough for 2 weeks and dyspnea now at rest; no wheezing or hemoptysis CV: reports lower extremity edema, PND, and orthopnea; no chest pain or palpitations GI: no abdominal pain, nausea, vomiting, diarrhea, or constipation GU: no dysuria, frequency, or hematuria Skin: reports blisters and weeping on lower extremities; no rash Neuro: no headache, numbness, tingling, or weakness of extremities Musculoskeletal: no joint pain or swelling.  Heme: no bleeding, DVT, or easy bruising Endo: no polydipsia or polyuria    Physical Exam: Blood pressure 124/73, pulse 83, temperature 97.9 F (36.6 C), temperature source Oral, resp. rate 20, height 5\' 1"  (1.549 m), weight 97.977 kg (216 lb), SpO2 95 %.   General appearance: alert, cooperative, appears older than stated age, mild distress and morbidly obese Neck: no adenopathy, no carotid bruit, supple, symmetrical, trachea midline, thyroid not enlarged, symmetric, no tenderness/mass/nodules and + JVD Lungs: crackles in bilateral bases Chest wall: no tenderness Heart: regular rate and rhythm, S1, S2 normal, no murmur, click, rub or gallop and very distant heart sounds Abdomen: normal findings: no organomegaly and abnormal findings:  obese and mildly distended, abdominal wall edema noted. Unable to make out organomegally appropriately due to obesity. Extremities: 4+ lower extremity pitting edema, blisters and ulcerations noted with darkly pigmented skin Pulses: unable to palpate LE pulses d/t edema and obesity. No carotid bruit.  Labs:   Lab Results  Component Value Date   WBC 16.5* 09/07/2014   HGB 13.6 09/07/2014   HCT 40.8 09/07/2014   MCV 84.8 09/07/2014   PLT 234 09/07/2014    Recent Labs Lab 06-Aug-2014 1942 09/07/14 0307  NA 132* 131*  K 4.9 4.5  CL 97 101  CO2 28 20  BUN 44* 46*  CREATININE 2.19* 2.20*  CALCIUM 8.5 8.0*  PROT 5.3*  --   BILITOT  1.3*  --   ALKPHOS 139*  --   ALT 28  --   AST 33  --   GLUCOSE 321* 280*   Lab Results  Component Value Date   TROPONINI 5.18* 09/07/2014    Lipid Panel  No results found for: CHOL, TRIG, HDL, CHOLHDL, VLDL, LDLCALC  EKG 09/07/2014: Normal sinus rhythm at the rate of 89 bpm, normal axis.  Poor R-wave progression, cannot exclude anterior infarct old.  Diffuse nonspecific T-wave flattening/inversion in the lateral leads..  Echocardiogram 09/07/2014: Markedly reduced LVEF, poor echo window.  Ejection fraction around 20-25% with grade 2 diastolic dysfunction.  Cannot exclude apical mural thrombus.  Global hypokinesis, severe.  Inferior, inferolateral and inferoseptal akinesis.   Radiology:  Dg Chest Portable 1 View  03-Oct-2014   CLINICAL DATA:  Shortness of breath. Dry cough for 1 week. Congestion.  EXAM: PORTABLE CHEST - 1 VIEW  COMPARISON:  None.  FINDINGS: Indistinct obscuration left hemidiaphragm, probably from a left pleural effusion with passive atelectasis, although left lower lobe pneumonia is not excluded. The right lung appears clear. Mild enlargement of the cardiopericardial silhouette. Thoracic spondylosis.  IMPRESSION: 1. Hazy obscuration left hemidiaphragm -differential diagnostic considerations include left lower lobe atelectasis, left lower lobe pneumonia, or a layering pleural effusion. 2. Mild enlargement of the cardiopericardial silhouette.   Electronically Signed   By: Gaylyn Rong M.D.   On: 03-Oct-2014 20:42     ASSESSMENT AND PLAN:  1. Acute systolic and diastolic heart failure 2. NSTEMI 3.  NSVT, patient had a 10 beat run of nonsustained with the tachycardia at 8 AM on 09/07/2014 4. DVT, cannot exclude whether acute or chronic, right lower extremity.  Poor visualization, DVT left leg in the proximal segment cannot be completely excluded. 5.  HTN 6.  Diabetes mellitus type II uncontrolled with renal manifestation 7. ARF, lactic acidosis, leukocytosis 8.   Anasarca 9.  Complete blindness, diabetic nephropathy, diabetic neuropathy, diabetic retinopathy. 10.  Morbid obesity with obesity hypoventilation, BMI 40.8.  Recommendation: patient is critically ill, I suspect her metabolic issues are due to low cardiac output.  Patient is not a candidate for aggressive invasive cardiac workup due to her underlying comorbidities.  She has probably completed her infarct she is not a candidate at this point for coronary angiography in view of for underlying metabolic and medical issues.  Conservative therapy is indicated.  Agree with Asian being DNR.  Transfer the patient to stepdown unit for diuresis.  Patient has not responded to IV diuretics, I will start the patient on Primacor per pharmacy consult, if her blood pressure does not tolerate, may need dopamine/dobutamine  Support.  We will start the patient on Lasix around-the-clock.  Electrolytes, renal function will be followed through.  Extremely complex and difficult situation with very high morbidity and mortality.  Patient's daughter present at the bedside, all issues discussed. I will continue IV heparin for now, aspirin contraindicated due to history of anaphylaxis. We will add clopidogrel 75 mg by mouth daily.  ACE inhibitor's are obviously contraindicated for now due to renal failure, will hold off on the beta blockers due to hypotension and cardiac shock. I may consider addition of digoxin in spite of non-ST elevation myocardial infarction for symptomatic relief.  I have discussed the findings with Dr. Marca Ancona, we will ask his help in management of the patient both inpatient and probably outpatient CHF clinic due to multiple medical comorbidities.  Pamella Pert, MD.  09/07/2014, 8:24 AM [facility] Pager: [phone] Office: [phone]

## 2014-09-07 NOTE — Care Management Note (Unsigned)
    Page 1 of 1   09/07/2014     3:37:26 PM CARE MANAGEMENT NOTE 09/07/2014  Patient:  Ledell PeoplesTERSON,Capricia E   Account Number:  [id number]  Date Initiated:  09/07/2014  Documentation initiated by:  Gay Rape  Subjective/Objective Assessment:   Pt adm on 09/04/2014 with NSTEMI, volume overload.  PTA, pt resided at home alone.  She has supportive daughter, per report.     Action/Plan:   Will follow for dc needs as pt progresses.   Anticipated DC Date:  09/11/2014   Anticipated DC Plan:  HOME W HOME HEALTH SERVICES      DC Planning Services  CM consult      Choice offered to / List presented to:             Status of service:  In process, will continue to follow Medicare Important Message given?   (If response is "NO", the following Medicare IM given date fields will be blank) Date Medicare IM given:   Medicare IM given by:   Date Additional Medicare IM given:   Additional Medicare IM given by:    Discharge Disposition:    Per UR Regulation:  Reviewed for med. necessity/level of care/duration of stay  If discussed at Long Length of Stay Meetings, dates discussed:    Comments:

## 2014-09-07 NOTE — Progress Notes (Signed)
ANTICOAGULATION CONSULT NOTE  Pharmacy Consult for heparin Indication: pulmonary embolism  Allergies  Allergen Reactions  . Bufferin Low Dose [Aspirin] Anaphylaxis    Swelling in face and cough, laryngospasm  . Iodine Nausea And Vomiting    Crab meat  . Neosporin Original [Bacitracin-Neomycin-Polymyxin] Rash    Bacitracin- makes her skin red and rashy  . Sulfites Other (See Comments)    Turns her face purple and feels hot    Patient Measurements: Height: 5\' 1"  (154.9 cm) Weight: 216 lb (97.977 kg) (bed scale) IBW/kg (Calculated) : 47.8 Heparin Dosing Weight: 66.3 kg  Vital Signs: Temp: 98.3 F (36.8 C) (04/07 1900) Temp Source: Oral (04/07 1900) BP: 121/58 mmHg (04/07 1900) Pulse Rate: 85 (04/07 1900)  Labs:  Recent Labs  2014/06/10 1942 09/07/14 0307 09/07/14 0320 09/07/14 0658 09/07/14 1419 09/07/14 1910  HGB 15.9* 13.6  --   --   --   --   HCT 47.0* 40.8  --   --   --   --   PLT 249 234  --   --   --   --   LABPROT 15.2  --   --   --   --   --   INR 1.19  --   --   --   --   --   HEPARINUNFRC  --  0.41  --   --  0.47 0.28*  CREATININE 2.19* 2.20*  --   --   --   --   TROPONINI  --   --  5.18* 5.76*  --   --     Estimated Creatinine Clearance: 27.7 mL/min (by C-G formula based on Cr of 2.2).  Assessment: 64 y.o. female with possible PE, positive DVT for heparin.  Initial heparin level was therapeutic but follow up level is now down below goal at 0.28. No bleeding issues noted, infusing appropriately.  Goal of Therapy:  Heparin level 0.3-0.7 units/ml Monitor platelets by anticoagulation protocol: Yes   Plan:  Increase Heparin to 1250 units/hr Recheck level in am F/u start oral agent  Sheppard CoilFrank Wilson PharmD., BCPS Clinical Pharmacist Pager [phone] 09/07/2014 8:40 PM

## 2014-09-07 NOTE — Progress Notes (Signed)
ANTICOAGULATION CONSULT NOTE  Pharmacy Consult for heparin Indication: pulmonary embolism  Allergies  Allergen Reactions  . Bufferin Low Dose [Aspirin] Anaphylaxis    Swelling in face and cough, laryngospasm  . Iodine Nausea And Vomiting    Crab meat  . Neosporin Original [Bacitracin-Neomycin-Polymyxin] Rash    Bacitracin- makes her skin red and rashy  . Sulfites Other (See Comments)    Turns her face purple and feels hot    Patient Measurements: Height: 5\' 1"  (154.9 cm) Weight: 216 lb (97.977 kg) (bed scale) IBW/kg (Calculated) : 47.8 Heparin Dosing Weight: 66.3 kg  Vital Signs: Temp: 97.9 F (36.6 C) (04/07 0641) Temp Source: Oral (04/07 0641) BP: 109/84 mmHg (04/07 0834) Pulse Rate: 80 (04/07 0834)  Labs:  Recent Labs  09/05/2014 1942 09/07/14 0307 09/07/14 0320 09/07/14 0658 09/07/14 1419  HGB 15.9* 13.6  --   --   --   HCT 47.0* 40.8  --   --   --   PLT 249 234  --   --   --   LABPROT 15.2  --   --   --   --   INR 1.19  --   --   --   --   HEPARINUNFRC  --  0.41  --   --  0.47  CREATININE 2.19* 2.20*  --   --   --   TROPONINI  --   --  5.18* 5.76*  --     Estimated Creatinine Clearance: 27.7 mL/min (by C-G formula based on Cr of 2.2).  Assessment: 64 y.o. female with possible PE, positive DVT for heparin.  Initial heparin level is therapeutic.  Goal of Therapy:  Heparin level 0.3-0.7 units/ml Monitor platelets by anticoagulation protocol: Yes   Plan:  Continue Heparin at 1100 units/hr Recheck level later today to verify F/u start oral agent  Talbert CageSeay, Mandel Seiden Poteet 09/07/2014,3:12 PM

## 2014-09-07 NOTE — Progress Notes (Signed)
  Echocardiogram 2D Echocardiogram has been performed.  Janalyn HarderWest, Linea Calles R 09/07/2014, 1:19 PM

## 2014-09-07 NOTE — Progress Notes (Signed)
Troponin 5.76 called in by Zelda from lab.  Informed Dr. Glenard HaringModing.  New new orders.  Will continue to monitor.  Amanda PeaNellie Shericka Johnstone, Charity fundraiserN.

## 2014-09-07 NOTE — Progress Notes (Addendum)
INITIAL NUTRITION ASSESSMENT  DOCUMENTATION CODES Per approved criteria  -Morbid Obesity   INTERVENTION: Glucerna Shake po BID, each supplement provides 220 kcal and 10 grams of protein Provide Pro-stat BID, each dose provides 15 grams of protein and 100 kcal Provide Multivitamin with minerals daily Encourage healthful diet with protein-rich foods  NUTRITION DIAGNOSIS: Inadequate oral intake related to acute illness and pain as evidenced by <25% meal completion.   Goal: Pt to meet >/= 90% of their estimated nutrition needs   Monitor:  PO intake, weight trend, labs  Reason for Assessment: Consult for Malnutrition  64 y.o. female  Admitting Dx: Shortness of breath  ASSESSMENT: 64 y/o female with PMH of HTN, prediabetes, total blindness who p/w progressive SOB, cough and LE swelling over the past week.  Pt states that her appetite has been poor for the past 4-5 days and she has been eating about 50% less than usual. She states she was eating well prior to getting a cold; eats 3 meals daily with fruits, vegetables and grains. RD emphasized the importance of nutrition. Encouraged protein-rich foods at each meal as well as fruits and vegetables. Pt reports that she doesn't like meat much and doesn't eat much protein. Reviewed protein-rich foods. Pt agreeable to trying Glucerna Shakes until appetite improves. Pt states that she used to weigh 170 lbs and eat very healthy; since her health has declined she has been more sedentary.   Labs: low sodium, elevated glucose, low calcium, low GFR  Nutrition Focused Physical Exam:  Subcutaneous Fat:  Orbital Region: mild wasting Upper Arm Region: mild wasting Thoracic and Lumbar Region: wnl  Muscle:  Temple Region: mild wasting Clavicle Bone Region: wnl Clavicle and Acromion Bone Region: wnl Scapular Bone Region: NA Dorsal Hand: mild wasting Patellar Region: wnl Anterior Thigh Region: wnl Posterior Calf Region: wnl  Edema: +2  generalized edema, +4 RLE and LLE edema, +2 perineal edema   Height: Ht Readings from Last 1 Encounters:  09/07/2014  (1.549 m)    Weight: Wt Readings from Last 1 Encounters:  09/16/2014 216 lb (97.977 kg)    Ideal Body Weight: 105 lbs  % Ideal Body Weight: 205%  Wt Readings from Last 10 Encounters:  09/25/2014 216 lb (97.977 kg)    Usual Body Weight: 170 lbs  % Usual Body Weight: 127%  BMI:  Body mass index is 40.83 kg/(m^2). (Morbid Obesity)  Estimated Nutritional Needs: Kcal: 1500-1700 Protein: 85-100 grams Fluid: 2.6 L/day  Skin: +4 RLE and LLE edema; blisters and weeping on legs  Diet Order: Diet Heart Room service appropriate?: Yes; Fluid consistency:: Thin  EDUCATION NEEDS: -No education needs identified at this time   Intake/Output Summary (Last 24 hours) at 09/07/14 1221 Last data filed at 09/07/14 0906  Gross per 24 hour  Intake 1230.83 ml  Output     25 ml  Net 1205.83 ml    Last BM: 4/6  Labs:   Recent Labs Lab 09/28/2014 1942 09/07/14 0307  NA 132* 131*  K 4.9 4.5  CL 97 101  CO2 28 20  BUN 44* 46*  CREATININE 2.19* 2.20*  CALCIUM 8.5 8.0*  GLUCOSE 321* 280*    CBG (last 3)   Recent Labs  09/07/14 0318 09/07/14 0557 09/07/14 1132  GLUCAP 250* 209* 97    Scheduled Meds: . azithromycin  500 mg Intravenous QHS  . cefTRIAXone (ROCEPHIN)  IV  2 g Intravenous Q24H  . furosemide  60 mg Intravenous Once  . insulin aspart  0-20 Units Subcutaneous TID WC  . insulin aspart  0-5 Units Subcutaneous QHS  . insulin detemir  10 Units Subcutaneous QHS  . ipratropium-albuterol  3 mL Nebulization BID  . sodium chloride  3 mL Intravenous Q12H    Continuous Infusions: . heparin 1,100 Units/hr (09/24/2014 2046)    Past Medical History  Diagnosis Date  . Hypertension   . Diabetes mellitus without complication     prediabetic  . Blind     both eyes  . 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 Fracture of left  humerus   . Glaucoma   . Cataract     Past Surgical History  Procedure Laterality Date  . Cesarean section  1987  . Tonsillectomy  1967    Ian Malkineanne Barnett RD, LDN Inpatient Clinical Dietitian Pager: [phone] After Hours Pager: [phone]

## 2014-09-07 NOTE — Consult Note (Signed)
WOC wound consult note Reason for Consult:  Evaluation of LE, weeping legs Wound type: both ruptured and intact bulla.  Excoriation left calf, pts daughter reports patient "really scratching" over the weekend.  Left heel suspected deep tissue injury.   Daughter and patient reported extreme deconditioning and very little ambulation in last week. She has been in pain and her legs have been limited in their movement from the pain.   She has 3+ pitting edema bilaterally. She has not used any type of compression in past to manage edema recently.  Her daughter reports "a while back they used stocking but she could not don and/or remove independently".  Pressure Ulcer POA: Yes Measurement: Pt is was in quite of bit of discomfort today and I was not able to measure the bulla Wound bed: She has intact bulla on the right medial pretibial area with one area that has ruptured and has partial thickness skin loss.  She also has multiple areas on the proximal pretibial area on the right LE that are reddened but are intact at this time.  Daughter reports they were blisters at home which she treated with Bacitracin and they have improved. She has intact flacid bulla on the left medial calf that is dark purple in color along with some darkening of the posterior calf.  She has many scratch marks over the medial proximal calf that are closed and not draining currently.  sDTI (suspeceted deep tissue injury) on the left heel.  Drainage (amount, consistency, odor) serous weeping noted from the bilateral legs, no cellulitis noted.   Periwound: as described above.  Dressing procedure/placement/frequency: Xeroform gauze to the open weeping areas and to the bulla to protect. Change every other day.    Discussed POC with patient and bedside nurse.  Re consult if needed, will not follow at this time. Thanks  Ahijah Devery [employer] Locker[employer]in RN, CWOCN [phone])

## 2014-09-07 NOTE — Progress Notes (Addendum)
Subjective:    Currently, the patient reports significant improvement in her shortness of breath.  She continues to have lower extremity pain that has not responded to Tylenol.  She thinks that her cough has improved.  Interval Events: -Vitals stable this morning.   Objective:    Vital Signs:   Temp:  [97.7 F (36.5 C)-98 F (36.7 C)] 97.9 F (36.6 C) (04/07 0641) Pulse Rate:  [80-93] 80 (04/07 0834) Resp:  [15-26] 20 (04/07 0641) BP: (99-132)/(37-90) 109/84 mmHg (04/07 0834) SpO2:  [92 %-100 %] 95 % (04/07 0641) Weight:  [180 lb (81.647 kg)-216 lb (97.977 kg)] 216 lb (97.977 kg) (04/06 2344) Last BM Date: 2014/11/05  24-hour weight change: Weight change:   Intake/Output:   Intake/Output Summary (Last 24 hours) at 09/07/14 0937 Last data filed at 09/07/14 0906  Gross per 24 hour  Intake 1230.83 ml  Output     25 ml  Net 1205.83 ml      Physical Exam: General: Well-developed, well-nourished, in no acute distress; alert, appropriate and cooperative throughout examination.  Lungs:  Slightly increased respiratory effort. Mild bilateral rales.  Heart: RRR. S1 and S2 normal without gallop, murmur, or rubs.  Abdomen:  BS normoactive. Soft, non-tender.  Extremities: 3+ pitting edema extending to the abdomen. Open wounds and blisters on bilateral lower extremities very tender to palpation.     Labs:  Basic Metabolic Panel:  Recent Labs Lab 2014/11/05 1942 09/07/14 0307  NA 132* 131*  K 4.9 4.5  CL 97 101  CO2 28 20  GLUCOSE 321* 280*  BUN 44* 46*  CREATININE 2.19* 2.20*  CALCIUM 8.5 8.0*    Liver Function Tests:  Recent Labs Lab 2014/11/05 1942  AST 33  ALT 28  ALKPHOS 139*  BILITOT 1.3*  PROT 5.3*  ALBUMIN 2.5*    CBC:  Recent Labs Lab 2014/11/05 1942 09/07/14 0307  WBC 20.6* 16.5*  NEUTROABS 17.3* 14.5*  HGB 15.9* 13.6  HCT 47.0* 40.8  MCV 84.8 84.8  PLT 249 234    Cardiac Enzymes:  Recent Labs Lab 09/07/14 0320 09/07/14 0658    TROPONINI 5.18* 5.76*    CBG:  Recent Labs Lab 2014/11/05 1947 09/07/14 0045 09/07/14 0318 09/07/14 0557  GLUCAP 281* 311* 250* 209*    Coagulation Studies:  Recent Labs  2014/11/05 1942  LABPROT 15.2  INR 1.19     Other results: EKG: Sinus rhythm, poor R-wave progression in anterior leads, T-wave inversion in I, II, and aVF.  Imaging: Dg Chest 2 View  09/07/2014   CLINICAL DATA:  Difficulty breathing  EXAM: CHEST  2 VIEW  COMPARISON:  September 06, 2014  FINDINGS: There is a persistent left pleural effusion with left base atelectasis. There is a new small right effusion. Lungs are otherwise clear. Heart is mildly enlarged with pulmonary vascularity within normal limits. No adenopathy. There is degenerative change in the thoracic spine.  IMPRESSION: Persistent left effusion with left base atelectasis. New small right effusion. No change in cardiac silhouette.   Electronically Signed   By: Bretta BangWilliam  Woodruff III M.D.   On: 09/07/2014 09:08   Dg Chest Portable 1 View  09/21/2014   CLINICAL DATA:  Shortness of breath. Dry cough for 1 week. Congestion.  EXAM: PORTABLE CHEST - 1 VIEW  COMPARISON:  None.  FINDINGS: Indistinct obscuration left hemidiaphragm, probably from a left pleural effusion with passive atelectasis, although left lower lobe pneumonia is not excluded. The right lung appears clear. Mild enlargement of the  cardiopericardial silhouette. Thoracic spondylosis.  IMPRESSION: 1. Hazy obscuration left hemidiaphragm -differential diagnostic considerations include left lower lobe atelectasis, left lower lobe pneumonia, or a layering pleural effusion. 2. Mild enlargement of the cardiopericardial silhouette.   Electronically Signed   By: Gaylyn Rong M.D.   On: 09/18/2014 20:42       Medications:    Infusions: . heparin 1,100 Units/hr (09/28/2014 2046)    Scheduled Medications: . azithromycin  500 mg Intravenous QHS  . cefTRIAXone (ROCEPHIN)  IV  2 g Intravenous Q24H  .  insulin aspart  0-20 Units Subcutaneous TID WC  . insulin aspart  0-5 Units Subcutaneous QHS  . ipratropium-albuterol  3 mL Nebulization Q4H  . sodium chloride  3 mL Intravenous Q12H    PRN Medications: acetaminophen, dextromethorphan-guaiFENesin   Assessment/ Plan:    Principal Problem:   Shortness of breath Active Problems:   Hyponatremia   AKI (acute kidney injury)   Elevated troponin   Malnutrition   Elevated alkaline phosphatase level   Lactic acid acidosis   Cough   SOB (shortness of breath)   Dyspnea   Hyperglycemia   Lower extremity edema   Fluid overload   Skin breakdown   NSTEMI (non-ST elevated myocardial infarction)  #NSTEMI Troponin trended up slightly on repeat measurement. -Follow-up cardiology recommendations. -Continue heparin drip. -Follow-up echocardiogram. -Heart diet.  #Probable congestive heart failure Symptoms are most consistent with volume overload from likely heart failure. She had an elevated d-dimer and pain in her legs, so pulmonary embolism was a concern. However, she is unable to undergo a CTA given her kidney failure, and her opacities on chest x-ray will limit a ventilation/perfusion scan. We'll start with lower extremity Dopplers, but management will not change given the fact that she is on heparin already. Urine drug screen negative. -Follow-up echocardiogram. -Strict ins and outs. -Lasix 60 mg IV once. -Norco 5-325 milligrams every 6 hours when necessary for leg pain. -Wound care has been consulted and saw the patient will follow their recommendations. -Follow-up lower extremity Dopplers.  #Community acquired pneumonia Strep urinary antigen positive along with opacity on chest x-ray and cough. -Continue ceftriaxone and azithromycin. Consider narrowing based on strep urinary antigen results. -Follow-up legionella urinary antigen. -Follow-up pro-calcitonin. -Continue Mucinex. -Trend lactic acid. -DuoNeb's every 4 hours.  #Acute  versus chronic kidney disease Creatinine elevated to 2.2 on presentation, previously 1.01 year ago. Unclear if this is progression of chronic kidney disease versus AKI. Urine electrolytes not valid given administration of Lasix prior to collection.  #Elevated alkaline phosphatase and total bilirubin GGT also elevated. Etiology remains unclear. Was elevated one year ago. -Trend CMP. -Consider right upper quadrant ultrasound.  #Prediabetes Reported per patient. -Follow-up hemoglobin A1c. -Capillary blood glucose before meals at bedtime with sliding scale insulin resistant.  #Blindness Likely secondary to cataracts with questionable glaucoma. -Outpatient workup.  #Malnutrition and failure to thrive She does not appear able to manage herself at home, and her daughter is unable to provide necessary care. She would benefit from at least placement in a skilled nursing facility at discharge. -Consult social work. -Consult to dietitian. -Consult PT and OT when patient is able.   DVT PPX - heparin  CODE STATUS - DNR/DNI  CONSULTS PLACED - Cardiology.  DISPO - Disposition is deferred at this time, awaiting improvement of multiple medical problems.   Anticipated discharge in approximately 4-6 day(s).   The patient does not have a current PCP (Pcp Not In System) and does need an [facility] hospital  follow-up appointment after discharge.    Is the [facility] hospital follow-up appointment a one-time only appointment? yes.  Does the patient have transportation limitations that hinder transportation to clinic appointments? yes   SERVICE NEEDED AT DISCHARGE - TO BE DETERMINED DURING HOSPITAL COURSE         Y = Yes, Blank = No PT:   OT:   RN:   Equipment:   Other:      Length of Stay: 1 day(s)   Signed: Donavan Foil, MD  PGY-1, Internal Medicine Resident Pager: [phone] (7AM-5PM) 09/07/2014, 9:37 AM

## 2014-09-07 NOTE — Progress Notes (Signed)
Inpatient Diabetes Program Recommendations  AACE/ADA: New Consensus Statement on Inpatient Glycemic Control (2013)  Target Ranges:  Prepandial:   less than 140 mg/dL      Peak postprandial:   less than 180 mg/dL (1-2 hours)      Critically ill patients:  140 - 180 mg/dL  Results for Ledell PeoplesTERSON, Natallie E (MRN [id number]) as of 09/07/2014 10:44  Ref. Range 09/23/2014 19:47 09/07/2014 00:45 09/07/2014 03:18 09/07/2014 05:57  Glucose-Capillary Latest Range: 70-99 mg/dL 782281 (H) 956311 (H) 213250 (H) 209 (H)   Inpatient Diabetes Program Recommendations Insulin - Basal: add low dose basal Lantus or Levemir 10 units  Thank you  Piedad ClimesGina Onix Jumper BSN, RN,CDE Inpatient Diabetes Coordinator [phone] (team pager)

## 2014-09-07 NOTE — Progress Notes (Signed)
Pt off floor to radiology for 2 view cxr and on heparin w/continuous beeping of iv machine.  Accompanied pt to correct beeping noise unable to.  As a resul iv pum turned off until pt is back on the floor.  Floor pharmacist Donnald GarreLara, made aware and instructed to let her know when pt is back.  Will continue to monitor.  Amanda PeaNellie Ritu Gagliardo, Charity fundraiserN.

## 2014-09-07 NOTE — Progress Notes (Signed)
Patient awoke from sleep with panic attack happening.  She felt like she was not able to breathe and that she was being held down.  Sat patient up on side of the bed and worked on breathing techniques.  She became more calm.  Readjudest her in bed and is now calm.  Spoke with Mallory, Intern about this episode.  Vitals were stable. o2 97% on 2LNC.  She ordered to await a further plan for today and let her know if it happens again.  Will continue to monitor.

## 2014-09-07 NOTE — Progress Notes (Signed)
ANTICOAGULATION CONSULT NOTE  Pharmacy Consult for heparin Indication: pulmonary embolism  Allergies  Allergen Reactions  . Bufferin Low Dose [Aspirin] Anaphylaxis    Swelling in face and cough, laryngospasm  . Iodine Nausea And Vomiting    Crab meat  . Neosporin Original [Bacitracin-Neomycin-Polymyxin] Rash    Bacitracin- makes her skin red and rashy  . Sulfites Other (See Comments)    Turns her face purple and feels hot    Patient Measurements: Height: 5\' 1"  (154.9 cm) Weight: 216 lb (97.977 kg) (bed scale) IBW/kg (Calculated) : 47.8 Heparin Dosing Weight: 66.3 kg  Vital Signs: Temp: 98 F (36.7 C) (04/06 2344) Temp Source: Oral (04/06 2344) BP: 109/86 mmHg (04/06 2344) Pulse Rate: 93 (04/06 2344)  Labs:  Recent Labs  09/04/2014 1942 09/07/14 0307  HGB 15.9* 13.6  HCT 47.0* 40.8  PLT 249 234  LABPROT 15.2  --   INR 1.19  --   HEPARINUNFRC  --  0.41  CREATININE 2.19* 2.20*    Estimated Creatinine Clearance: 27.7 mL/min (by C-G formula based on Cr of 2.2).  Assessment: 64 y.o. female with possible PE for heparin  Goal of Therapy:  Heparin level 0.3-0.7 units/ml Monitor platelets by anticoagulation protocol: Yes   Plan:  Continue Heparin at current rate for now Recheck level in 6 hrs to verify  Eddie Candlebbott, Liadan Guizar Vernon 09/07/2014,5:02 AM

## 2014-09-07 NOTE — Progress Notes (Signed)
Pt had 10 beats of V Tach.  Pt asymptomatic.  Clarisse GougeBridget with cardiology informed and said she will take a look at it.  Will continue to monitor.  Amanda PeaNellie Avalynne Diver, Charity fundraiserN.

## 2014-09-07 NOTE — Progress Notes (Addendum)
CRITICAL VALUE ALERT  Critical value received:  Lactic acid 2.8   Date of notification:  09/07/14  Time of notification:  0448  Critical value read back:Yes.    Nurse who received alert:  Lawana ChambersHannah Madiline Saffran, bsn, rn  MD notified (1st page):  Mallory, intern for internal medicine  Time of first page:  0448  MD notified (2nd page): n/a  Time of second page: n/a  Responding MD:     Time MD responded:

## 2014-09-07 NOTE — Progress Notes (Signed)
CRITICAL VALUE ALERT  Critical value received:  Troponin 5.18  Date of notification:  09/07/14  Time of notification:  0515  Critical value read back:Yes.    Nurse who received alert:  Lawana ChambersHannah Melitza Metheny, bsn, rn  MD notified (1st page):  Mallory intern  Time of first page:  [phone]  MD notified (2nd page):  Time of second page:  Responding MD:  n/a  Time MD responded:  n/a

## 2014-09-07 NOTE — Progress Notes (Signed)
[facility].  Verbalized understanding.  Amanda PeaNellie Jonnie Kubly, Charity fundraiserN.

## 2014-09-07 NOTE — Progress Notes (Signed)
VASCULAR LAB PRELIMINARY  PRELIMINARY  PRELIMINARY  PRELIMINARY  BLEV completed.    Preliminary report:  Positive DVT RLEV:  Echogenic filling defect behind valve leaflet of the very proximal portion RT SFV suggestive of age determinant or chronic DVT.     Due to extensive edema, the femoral vein beyond the bifurcation of the profunda femoral is not visualized bilaterally.    Negative DVT LLEV in the visualized segments.  Tina Atkins, Tina Atkins, RVT 09/07/2014, 12:43 PM

## 2014-09-07 NOTE — Progress Notes (Signed)
MEDICATION RELATED CONSULT NOTE - INITIAL   Pharmacy Consult for Milrinone Indication: Heart  Failure  Allergies  Allergen Reactions  . Bufferin Low Dose [Aspirin] Anaphylaxis    Swelling in face and cough, laryngospasm  . Iodine Nausea And Vomiting    Crab meat  . Neosporin Original [Bacitracin-Neomycin-Polymyxin] Rash    Bacitracin- makes her skin red and rashy  . Sulfites Other (See Comments)    Turns her face purple and feels hot    Patient Measurements: Height: 5\' 1"  (154.9 cm) Weight: 216 lb (97.977 kg) (bed scale) IBW/kg (Calculated) : 47.8 Per patient dry weight 160lb Vital Signs: Temp: 97.7 F (36.5 C) (04/07 1627) Temp Source: Oral (04/07 1627) BP: 88/66 mmHg (04/07 1627) Pulse Rate: 73 (04/07 1627) Intake/Output from previous day: 04/06 0701 - 04/07 0700 In: 1150.8 [P.O.:350; I.V.:500.8; IV Piggyback:300] Out: 25 [Urine:25] Intake/Output from this shift: Total I/O In: 448.4 [P.O.:200; I.V.:198.4; IV Piggyback:50] Out: -   Labs:  Recent Labs  07-Sep-2014 1942 09/07/14 0307 09/07/14 0349  WBC 20.6* 16.5*  --   HGB 15.9* 13.6  --   HCT 47.0* 40.8  --   PLT 249 234  --   CREATININE 2.19* 2.20*  --   LABCREA  --   --  247.80  ALBUMIN 2.5*  --   --   PROT 5.3*  --   --   AST 33  --   --   ALT 28  --   --   ALKPHOS 139*  --   --   BILITOT 1.3*  --   --    Estimated Creatinine Clearance: 27.7 mL/min (by C-G formula based on Cr of 2.2).   Microbiology: No results found for this or any previous visit (from the past 720 hour(s)).  Medical History: Past Medical History  Diagnosis Date  . Hypertension   . Diabetes mellitus without complication     prediabetic  . Blind     both eyes  . 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 Fracture of left humerus   . Glaucoma   . Cataract     Medications:  Prescriptions prior to admission  Medication Sig Dispense Refill Last Dose  . b complex vitamins tablet Take 1 tablet by mouth 2  (two) times a week.   09/28/2014 at Unknown time  . guaiFENesin (MUCINEX) 600 MG 12 hr tablet Take 600 mg by mouth 2 (two) times daily as needed.   Past Week at Unknown time  . ibuprofen (ADVIL,MOTRIN) 200 MG tablet Take 400 mg by mouth every 6 (six) hours as needed for moderate pain.   09/24/2014 at Unknown time  . naproxen sodium (ANAPROX) 220 MG tablet Take 220 mg by mouth 2 (two) times daily as needed (FOR PAIN).   09/05/2014 at Unknown time  . PE-DM-APAP & Doxylamin-DM-APAP (LIQUID) MISC Take 2 capsules by mouth 2 (two) times daily as needed (FOR COLD).   Past Week at Unknown time    Assessment: 64 yo F admitted 09/17/2014 with several weeks of progressive dyspnea.  Current weight ~60lb over dry weight.  EF per ECHO today per Dr. Jacinto HalimGanji is 20%.  Minimal UOP with 60 mg IV lasix (20 x 1, then 40 IV x 1) to start milrinone per pharmacy  CV: HF EF 20%, NSTEMI, HTN, runs of VT 4/7 Wt 218 lb (est dry wt ~160 lb), BP 88/66, HR 70-80s PTA: no HF medications  Renal: CrCl ~30  ml/min,  K wnl  Goal of Therapy:  Renal dosing of milrinone  Plan:  Spoke with Dr. Nadara Eaton re BP and milrinone, given low EF and not HF medications PTA, SVR is likely high.  After transfer to SDU will initiate Milrinone at 0.125 mcg/kg/min with close vital sign follow up per order set.   Further dose adjustment per Dr. Jacinto Halim Entered order for magnesium, lasix 80 mg IV q8h to start after milrinone per Dr. Verl Dicker verbal order Pharmacy will follow peripherally, call with questions.  Thank you for allowing pharmacy to be a part of this patients care team.  Lovenia Kim Pharm.D., BCPS, AQ-Cardiology Clinical Pharmacist 09/07/2014 5:14 PM Pager: [phone] Phone: [phone]

## 2014-09-08 ENCOUNTER — Inpatient Hospital Stay (HOSPITAL_COMMUNITY): Payer: Medicaid Other

## 2014-09-08 DIAGNOSIS — I739 Peripheral vascular disease, unspecified: Secondary | ICD-10-CM

## 2014-09-08 DIAGNOSIS — R570 Cardiogenic shock: Secondary | ICD-10-CM

## 2014-09-08 DIAGNOSIS — I5041 Acute combined systolic (congestive) and diastolic (congestive) heart failure: Secondary | ICD-10-CM

## 2014-09-08 DIAGNOSIS — I82409 Acute embolism and thrombosis of unspecified deep veins of unspecified lower extremity: Secondary | ICD-10-CM

## 2014-09-08 DIAGNOSIS — M79661 Pain in right lower leg: Secondary | ICD-10-CM

## 2014-09-08 DIAGNOSIS — I214 Non-ST elevation (NSTEMI) myocardial infarction: Secondary | ICD-10-CM

## 2014-09-08 DIAGNOSIS — M79662 Pain in left lower leg: Secondary | ICD-10-CM

## 2014-09-08 DIAGNOSIS — R0602 Shortness of breath: Secondary | ICD-10-CM

## 2014-09-08 DIAGNOSIS — E119 Type 2 diabetes mellitus without complications: Secondary | ICD-10-CM

## 2014-09-08 LAB — URINE CULTURE: Colony Count: 35000

## 2014-09-08 LAB — CARBOXYHEMOGLOBIN
Carboxyhemoglobin: 0.9 % (ref 0.5–1.5)
Methemoglobin: 0.8 % (ref 0.0–1.5)
O2 Saturation: 52.4 %
Total hemoglobin: 12.7 g/dL (ref 12.0–16.0)

## 2014-09-08 LAB — BASIC METABOLIC PANEL
ANION GAP: 9 (ref 5–15)
BUN: 51 mg/dL — ABNORMAL HIGH (ref 6–23)
CALCIUM: 7.8 mg/dL — AB (ref 8.4–10.5)
CHLORIDE: 97 mmol/L (ref 96–112)
CO2: 23 mmol/L (ref 19–32)
CREATININE: 2.52 mg/dL — AB (ref 0.50–1.10)
GFR calc Af Amer: 22 mL/min — ABNORMAL LOW (ref >90)
GFR calc non Af Amer: 19 mL/min — ABNORMAL LOW (ref >90)
Glucose, Bld: 128 mg/dL — ABNORMAL HIGH (ref 70–99)
Potassium: 4.5 mmol/L (ref 3.5–5.1)
Sodium: 129 mmol/L — ABNORMAL LOW (ref 135–145)

## 2014-09-08 LAB — UREA NITROGEN, URINE: Urea Nitrogen, Ur: 559 mg/dL

## 2014-09-08 LAB — LIPID PANEL
Cholesterol: 177 mg/dL (ref 0–200)
HDL: 36 mg/dL — ABNORMAL LOW (ref >39)
LDL Cholesterol: 111 mg/dL — ABNORMAL HIGH (ref 0–99)
Total CHOL/HDL Ratio: 4.9 ratio
Triglycerides: 149 mg/dL (ref <150)
VLDL: 30 mg/dL (ref 0–40)

## 2014-09-08 LAB — HEPARIN LEVEL (UNFRACTIONATED): Heparin Unfractionated: 0.19 IU/mL — ABNORMAL LOW (ref 0.30–0.70)

## 2014-09-08 LAB — BRAIN NATRIURETIC PEPTIDE: B Natriuretic Peptide: 1742.1 pg/mL — ABNORMAL HIGH (ref 0.0–100.0)

## 2014-09-08 LAB — PHOSPHORUS: PHOSPHORUS: 4.7 mg/dL — AB (ref 2.3–4.6)

## 2014-09-08 LAB — GLUCOSE, CAPILLARY
Glucose-Capillary: 162 mg/dL — ABNORMAL HIGH (ref 70–99)
Glucose-Capillary: 166 mg/dL — ABNORMAL HIGH (ref 70–99)
Glucose-Capillary: 171 mg/dL — ABNORMAL HIGH (ref 70–99)

## 2014-09-08 LAB — TSH: TSH: 2.507 u[IU]/mL (ref 0.350–4.500)

## 2014-09-08 LAB — HEMOGLOBIN A1C
HEMOGLOBIN A1C: 11.5 % — AB (ref 4.8–5.6)
MEAN PLASMA GLUCOSE: 283 mg/dL

## 2014-09-08 LAB — TROPONIN I: Troponin I: 3.64 ng/mL (ref <0.031)

## 2014-09-08 LAB — PROTEIN / CREATININE RATIO, URINE
Creatinine, Urine: 124.94 mg/dL
Protein Creatinine Ratio: 0.74 — ABNORMAL HIGH (ref 0.00–0.15)
Total Protein, Urine: 93 mg/dL

## 2014-09-08 MED ORDER — WHITE PETROLATUM GEL
Status: AC
  Administered 2014-09-08: 0.2
  Filled 2014-09-08: qty 1

## 2014-09-08 MED ORDER — NOREPINEPHRINE BITARTRATE 1 MG/ML IV SOLN
0.0000 ug/min | INTRAVENOUS | Status: DC
  Administered 2014-09-08: 5 ug/min via INTRAVENOUS
  Administered 2014-09-09: 1 ug/min via INTRAVENOUS
  Administered 2014-09-13: 5 ug/min via INTRAVENOUS
  Filled 2014-09-08 (×3): qty 4

## 2014-09-08 MED ORDER — ATORVASTATIN CALCIUM 80 MG PO TABS
80.0000 mg | ORAL_TABLET | Freq: Every day | ORAL | Status: DC
  Administered 2014-09-08 – 2014-09-12 (×5): 80 mg via ORAL
  Filled 2014-09-08 (×6): qty 1

## 2014-09-08 MED ORDER — DOBUTAMINE IN D5W 4-5 MG/ML-% IV SOLN
2.5000 ug/kg/min | INTRAVENOUS | Status: DC
  Administered 2014-09-08: 2.5 ug/kg/min via INTRAVENOUS
  Filled 2014-09-08: qty 250

## 2014-09-08 MED ORDER — FUROSEMIDE 10 MG/ML IJ SOLN
160.0000 mg | Freq: Four times a day (QID) | INTRAVENOUS | Status: DC
  Administered 2014-09-08 – 2014-09-13 (×18): 160 mg via INTRAVENOUS
  Filled 2014-09-08 (×22): qty 16

## 2014-09-08 MED ORDER — FENTANYL CITRATE 0.05 MG/ML IJ SOLN
25.0000 ug | INTRAMUSCULAR | Status: DC | PRN
  Administered 2014-09-08: 25 ug via INTRAVENOUS
  Administered 2014-09-11 – 2014-09-13 (×7): 50 ug via INTRAVENOUS
  Filled 2014-09-08 (×9): qty 2

## 2014-09-08 MED ORDER — HEPARIN (PORCINE) IN NACL 100-0.45 UNIT/ML-% IJ SOLN
1450.0000 [IU]/h | INTRAMUSCULAR | Status: DC
  Administered 2014-09-08 – 2014-09-11 (×4): 1450 [IU]/h via INTRAVENOUS
  Filled 2014-09-08 (×5): qty 250

## 2014-09-08 MED ORDER — PIPERACILLIN-TAZOBACTAM 3.375 G IVPB
3.3750 g | Freq: Three times a day (TID) | INTRAVENOUS | Status: AC
  Administered 2014-09-08 – 2014-09-11 (×11): 3.375 g via INTRAVENOUS
  Filled 2014-09-08 (×12): qty 50

## 2014-09-08 MED ORDER — HYDROCODONE-ACETAMINOPHEN 5-325 MG PO TABS
1.0000 | ORAL_TABLET | Freq: Once | ORAL | Status: AC
  Administered 2014-09-08: 1 via ORAL

## 2014-09-08 MED ORDER — FUROSEMIDE 10 MG/ML IJ SOLN
80.0000 mg | Freq: Two times a day (BID) | INTRAMUSCULAR | Status: DC
  Administered 2014-09-08: 80 mg via INTRAVENOUS

## 2014-09-08 MED ORDER — HEPARIN BOLUS VIA INFUSION
2000.0000 [IU] | Freq: Once | INTRAVENOUS | Status: AC
  Administered 2014-09-08: 2000 [IU] via INTRAVENOUS
  Filled 2014-09-08: qty 2000

## 2014-09-08 NOTE — Progress Notes (Signed)
Pts. SBP was still in the low 80's, pt. feeling a little flush and hot this morning, due Lasix was held due to low blood pressure. Was given fentanyl for pain lt. Leg low dose. Dr Leatha GildingMallory was notified of the lasix and pts. symptoms of slightly flush. Will continue to monitor.

## 2014-09-08 NOTE — Progress Notes (Signed)
ANTICOAGULATION CONSULT NOTE - Follow Up Consult  Pharmacy Consult for Heparin Indication: DVT  Allergies  Allergen Reactions  . Bufferin Low Dose [Aspirin] Anaphylaxis    Swelling in face and cough, laryngospasm  . Iodine Nausea And Vomiting    Crab meat  . Neosporin Original [Bacitracin-Neomycin-Polymyxin] Rash    Bacitracin- makes her skin red and rashy  . Sulfites Other (See Comments)    Turns her face purple and feels hot    Patient Measurements: Height: 5\' 1"  (154.9 cm) Weight: 222 lb 10.6 oz (101 kg) IBW/kg (Calculated) : 47.8 Heparin Dosing Weight: ~66kg  Vital Signs: Temp: 98 F (36.7 C) (04/08 1234) Temp Source: Oral (04/08 1234) BP: 93/53 mmHg (04/08 1100) Pulse Rate: 96 (04/08 1100)  Labs:  Recent Labs  09/16/2014 1942  09/07/14 0307 09/07/14 0320 09/07/14 0658 09/07/14 1419 09/07/14 1910 09/08/14 0500 09/08/14 0648 09/08/14 1415  HGB 15.9*  --  13.6  --   --   --   --   --   --   --   HCT 47.0*  --  40.8  --   --   --   --   --   --   --   PLT 249  --  234  --   --   --   --   --   --   --   LABPROT 15.2  --   --   --   --   --   --   --   --   --   INR 1.19  --   --   --   --   --   --   --   --   --   HEPARINUNFRC  --   < > 0.41  --   --  0.47 0.28*  --   --  0.19*  CREATININE 2.19*  --  2.20*  --   --   --   --   --  2.52*  --   TROPONINI  --   --   --  5.18* 5.76*  --   --  3.64*  --   --   < > = values in this interval not displayed.  Estimated Creatinine Clearance: 24.6 mL/min (by C-G formula based on Cr of 2.52).   Medications:  Heparin @ 1250 units/hr  Assessment: 63yof with RLE DVT (per dopplers on 4/7) and NSTEMI (not a cath candidate) continues on IV heparin. Heparin level today is below goal at 0.19. No issues with infusion. No bleeding reported.  Goal of Therapy:  Heparin level 0.3-0.7 units/ml Monitor platelets by anticoagulation protocol: Yes   Plan:  1) Bolus heparin 2000 units x 1 2) Increase heparin drip to 1450  units/hr 3) Check 8 hour heparin level   Fredrik RiggerMarkle, Beuna Bolding Sue 09/08/2014,3:09 PM

## 2014-09-08 NOTE — Progress Notes (Addendum)
Pts. SBP was in the 70's-80's with the MAP of 60's. Pt. Was in a lot of pain and was given 2 doses of pain med. Denies any other symptoms except for throbbing on and pain in the left foot. Dr. Jacinto HalimGanji was paged and made aware of pts. BP and symptoms of pain. With orders made. Dr. Leatha GildingMallory was paged and also made aware of pts. Symptoms and BP. Will consult CCM for CVL placement since pt. Need to be started on dobutamine for BP. Dr. Leatha GildingMallory in and explained further information about central placement and consent was signed. Made also aware of the lower extremeties cool to touch, very poor perfusion and discolored heel and plantar. Pulses per doopler both feet,. Foley inserted as ordered. Heparin was order to hold for now awaiting for CCM to placed the CVL.

## 2014-09-08 NOTE — Progress Notes (Signed)
VASCULAR LAB PRELIMINARY  ARTERIAL  ABI completed:    RIGHT    LEFT    PRESSURE WAVEFORM  PRESSURE WAVEFORM  BRACHIAL 78 triphasic BRACHIAL N/A  IV site   DP  ABSENT DP  ABSENT  AT  ABSENT AT  ABSENT  PT  ABSENT PT  ABSENT  PER  ABSENT PER  ABSENT  GREAT TOE  ABSENT GREAT TOE  ABSENT    RIGHT LEFT  ABI N/A N/A    Pedal doppler waveforms and signals not obtainable, possible due to occlusion.   Loralie ChampagneBishop, Ponce Skillman F, RVT 09/08/2014, 10:23 AM

## 2014-09-08 NOTE — Procedures (Signed)
Central Venous Catheter Insertion Procedure Note Tina Atkins [id number] 10/20/1950  Procedure: Insertion of Central Venous Catheter Indications: Assessment of intravascular volume, Drug and/or fluid administration and Frequent blood sampling  Procedure Details Consent: Risks of procedure as well as the alternatives and risks of each were explained to the (patient/caregiver).  Consent for procedure obtained. Time Out: Verified patient identification, verified procedure, site/side was marked, verified correct patient position, special equipment/implants available, medications/allergies/relevent history reviewed, required imaging and test results available.  Performed  Maximum sterile technique was used including antiseptics, cap, gloves, gown, hand hygiene, mask and sheet. Skin prep: Chlorhexidine; local anesthetic administered A antimicrobial bonded/coated triple lumen catheter was placed in the left internal jugular vein using the Seldinger technique.  Evaluation Blood flow good Complications: No apparent complications Patient did tolerate procedure well. Chest X-ray ordered to verify placement.  CXR: pending.  Procedure performed under direct ultrasound guidance for real time vessel cannulation.      [name], [place]PA - [facility] Pager: [phone]  or [phone] 09/08/2014, 5:01 AM

## 2014-09-08 NOTE — Consult Note (Addendum)
Advanced Heart Failure Team Consult Note  Referring Physician: Dr Nadara Eaton Primary Physician: None  Primary Cardiologist:  None   Reason for Consultation: Heart Failure with worsening renal function.   HPI:    Tina Atkins is a 64 year old with a history of HTN, blindness due to glaucoma, admitted with increased dyspnea and lower extremity edema. She is allergic to aspirin and had anaphylaxix . No previous cardiac history.   Yesterday she was evaluated by Dr Nadara Eaton. Not felt to be cath candidate. She was started on milrinone 0.125 mcg. Over night she was hypotensive and dobutamine was started. Had central line placed. She continued on lasix. Weight up 6 pounds.   ECHO 09/07/2014 EF 20-25% RV mildly dilated.  Lower extremtiy doppler- RLE.     Pertinent  labs included troponin 3.06>5.18>5.76  Creatinine 2.1>2.2>2.5 Sodium 129  Review of Systems: [y] = yes,  = no   General: Weight gain ; Weight loss ; Anorexia ; Fatigue [ Y]; Fever ; Chills ; Weakness   Cardiac: Chest pain/pressure ; Resting SOB ; Exertional SOB [ Y]; Orthopnea ; Pedal Edema [Y ]; Palpitations ; Syncope ; Presyncope ; Paroxysmal nocturnal dyspnea[ ]   Pulmonary: Cough ; Wheezing[ ] ; Hemoptysis[ ] ; Sputum ; Snoring   GI: Vomiting[ ] ; Dysphagia[ ] ; Melena[ ] ; Hematochezia ; Heartburn[ ] ; Abdominal pain ; Constipation ; Diarrhea ; BRBPR   GU: Hematuria[ ] ; Dysuria ; Nocturia[ ]    Vascular: Pain in legs with walking ; Pain in feet with lying flat ; Non-healing sores ; Stroke ; TIA ; Slurred speech ;  Neuro: Headaches[ ] ; Vertigo[ ] ; Seizures[ ] ; Paresthesias[ ] ;Blurred vision ; Diplopia ; Vision changes   Ortho/Skin: Arthritis ; Joint pain ; Muscle pain ; Joint swelling ; Back Pain ; Rash   Psych: Depression[ ] ; Anxiety[ ]   Heme: Bleeding problems ; Clotting disorders ; Anemia   Endocrine: Diabetes [Y ];  Thyroid dysfunction[ ]   Home Medications Prior to Admission medications   Medication Sig Start Date End Date Taking? Authorizing Provider  b complex vitamins tablet Take 1 tablet by mouth 2 (two) times a week.   Yes Historical Provider, MD  guaiFENesin (MUCINEX) 600 MG 12 hr tablet Take 600 mg by mouth 2 (two) times daily as needed.   Yes Historical Provider, MD  ibuprofen (ADVIL,MOTRIN) 200 MG tablet Take 400 mg by mouth every 6 (six) hours as needed for moderate pain.   Yes Historical Provider, MD  naproxen sodium (ANAPROX) 220 MG tablet Take 220 mg by mouth 2 (two) times daily as needed (FOR PAIN).   Yes Historical Provider, MD  PE-DM-APAP & Doxylamin-DM-APAP (LIQUID) MISC Take 2 capsules by mouth 2 (two) times daily as needed (FOR COLD).   Yes Historical Provider, MD    Past Medical History: Past Medical History  Diagnosis Date  . Hypertension   . Diabetes mellitus without complication     prediabetic  . Blind     both eyes  . 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 Fracture of left humerus   . Glaucoma   . Cataract     Past Surgical History: Past Surgical History  Procedure Laterality Date  . Cesarean section  1987  . Tonsillectomy  [id number]    Family History: Family History  Problem Relation Age of Onset  . Heart disease Father   . Diabetes Father     Social History: History   Social History  . Marital Status: Single    Spouse Name: N/A  . Number of Children: N/A  . Years of Education: N/A   Social History Main Topics  . Smoking status: Never Smoker   . Smokeless tobacco: Not on file  . Alcohol Use: No  . Drug Use: Not on file  . Sexual Activity: Not on file   Other Topics Concern  . None   Social History Narrative   1 daughter (locally), 1 son (lives in [place])   Lives alone, blind   Divorced    Unemployed    Moved here from [place]   Never had mammogram/colonoscopy and does not want either          Allergies:  Allergies   Allergen Reactions  . Bufferin Low Dose [Aspirin] Anaphylaxis    Swelling in face and cough, laryngospasm  . Iodine Nausea And Vomiting    Crab meat  . Neosporin Original [Bacitracin-Neomycin-Polymyxin] Rash    Bacitracin- makes her skin red and rashy  . Sulfites Other (See Comments)    Turns her face purple and feels hot    Objective:    Vital Signs:   Temp:  [97.7 F (36.5 C)-98.4 F (36.9 C)] 98.4 F (36.9 C) (04/07 2300) Pulse Rate:  [49-108] 108 (04/08 0700) Resp:  [15-24] 17 (04/08 0700) BP: (70-124)/(41-84) 83/47 mmHg (04/08 0700) SpO2:  [92 %-100 %] 92 % (04/08 0700) Weight:  [222 lb 10.6 oz (101 kg)] 222 lb 10.6 oz (101 kg) (04/08 0600) Last BM Date: 08-08-2014  Weight change: Filed Weights   08-08-2014 1942 08-08-2014 2344 09/08/14 0600  Weight: 180 lb (81.647 kg) 216 lb (97.977 kg) 222 lb 10.6 oz (101 kg)    Intake/Output:   Intake/Output Summary (Last 24 hours) at 09/08/14 0755 Last data filed at 09/08/14 0700  Gross per 24 hour  Intake 1493.53 ml  Output    525 ml  Net 968.53 ml     Physical Exam: General:  Chronically-ill appearing. In pain due to LLE HEENT: normal Neck: supple. JVP to jaw  . Carotids 2+ bilat; no bruits. No lymphadenopathy or thryomegaly appreciated. Cor: PMI nondisplaced. Tachy regular rate & rhythm. No rubs, gallops or murmurs. Lungs: clear Abdomen: soft, nontender, nondistended. No hepatosplenomegaly. No bruits or masses. Good bowel sounds. Extremities: no cyanosis, clubbing, rash, R and LLE 3+ edema LLE cool and dusky. Partial thickness skin loss on posterior aspect.  Neuro: alert & orientedx3, blind.  moves all 4 extremities w/o difficulty. Affect pleasant  Telemetry: Sinus Tach   Labs: Basic Metabolic Panel:  Recent Labs Lab 08-08-2014 1942 09/07/14 0307 09/07/14 1910 09/08/14 0648  NA 132* 131*  --  129*  K 4.9 4.5  --  4.5  CL 97 101  --  97  CO2 28 20  --  23  GLUCOSE 321* 280*  --  128*  BUN 44* 46*  --  51*   CREATININE 2.19* 2.20*  --  2.52*  CALCIUM 8.5 8.0*  --  7.8*  MG  --   --  2.1  --     Liver Function Tests:  Recent Labs Lab 08-08-2014 1942  AST 33  ALT 28  ALKPHOS 139*  BILITOT 1.3*  PROT 5.3*  ALBUMIN 2.5*   No results for input(s):  LIPASE, AMYLASE in the last 168 hours. No results for input(s): AMMONIA in the last 168 hours.  CBC:  Recent Labs Lab 09/05/2014 1942 09/07/14 0307  WBC 20.6* 16.5*  NEUTROABS 17.3* 14.5*  HGB 15.9* 13.6  HCT 47.0* 40.8  MCV 84.8 84.8  PLT 249 234    Cardiac Enzymes:  Recent Labs Lab 09/07/14 0320 09/07/14 0658  TROPONINI 5.18* 5.76*    BNP: BNP (last 3 results)  Recent Labs  09/15/2014 1942 09/08/14 0647  BNP 3107.9* 1742.1*    ProBNP (last 3 results) No results for input(s): PROBNP in the last 8760 hours.   CBG:  Recent Labs Lab 09/07/14 0318 09/07/14 0557 09/07/14 1132 09/07/14 1626 09/07/14 2125  GLUCAP 250* 209* 97 110* 151*    Coagulation Studies:  Recent Labs  09/30/2014 1942  LABPROT 15.2  INR 1.19    Other results: EKG: Sinus tach No ST-T wave abnormalities.    Imaging: Dg Chest 2 View  09/07/2014   CLINICAL DATA:  Difficulty breathing  EXAM: CHEST  2 VIEW  COMPARISON:  September 06, 2014  FINDINGS: There is a persistent left pleural effusion with left base atelectasis. There is a new small right effusion. Lungs are otherwise clear. Heart is mildly enlarged with pulmonary vascularity within normal limits. No adenopathy. There is degenerative change in the thoracic spine.  IMPRESSION: Persistent left effusion with left base atelectasis. New small right effusion. No change in cardiac silhouette.   Electronically Signed   By: Bretta Bang III M.D.   On: 09/07/2014 09:08   US Renal  09/08/2014   CLINICAL DATA:  Renal failure.  Hypertension.  Diabetes.  EXAM: RENAL/URINARY TRACT ULTRASOUND COMPLETE  COMPARISON:  None.  FINDINGS: Right Kidney:  Length: 10.4 cm. Echogenicity within normal limits. No  mass or hydronephrosis visualized. 6 mm nonobstructing calyceal stone noted .  Left Kidney:  Length: 10.7 cm. Echogenicity within normal limits. No mass or hydronephrosis visualized.  Bladder:  Appears normal for degree of bladder distention.  IMPRESSION: 1. 6 mm nonobstructing calyceal stone right kidney. 2. Otherwise negative exam.   Electronically Signed   By: Maisie Fus  Register   On: 09/08/2014 07:08   Dg Chest Port 1 View  09/08/2014   CLINICAL DATA:  Central line placement  EXAM: PORTABLE CHEST - 1 VIEW  COMPARISON:  09/07/2014  FINDINGS: Interval placement of a left central venous catheter with tip over the cavoatrial junction. No pneumothorax. Cardiac enlargement. Increasing small bilateral pleural effusions and basilar atelectatic changes. No pneumothorax. Mediastinal contours appear intact.  IMPRESSION: Appliances appear in satisfactory position. Cardiac enlargement with increasing pleural effusions and basilar atelectatic changes since previous study.   Electronically Signed   By: Burman Nieves M.D.   On: 09/08/2014 05:44   Dg Chest Portable 1 View  09/28/2014   CLINICAL DATA:  Shortness of breath. Dry cough for 1 week. Congestion.  EXAM: PORTABLE CHEST - 1 VIEW  COMPARISON:  None.  FINDINGS: Indistinct obscuration left hemidiaphragm, probably from a left pleural effusion with passive atelectasis, although left lower lobe pneumonia is not excluded. The right lung appears clear. Mild enlargement of the cardiopericardial silhouette. Thoracic spondylosis.  IMPRESSION: 1. Hazy obscuration left hemidiaphragm -differential diagnostic considerations include left lower lobe atelectasis, left lower lobe pneumonia, or a layering pleural effusion. 2. Mild enlargement of the cardiopericardial silhouette.   Electronically Signed   By: Gaylyn Rong M.D.   On: 09/17/2014 20:42      Medications:     Current  Medications: . azithromycin  500 mg Intravenous QHS  . cefTRIAXone (ROCEPHIN)  IV  2 g  Intravenous Q24H  . clopidogrel  75 mg Oral Daily  . feeding supplement (GLUCERNA SHAKE)  237 mL Oral BID BM  . feeding supplement (PRO-STAT SUGAR FREE 64)  30 mL Oral BID WC  . furosemide  80 mg Intravenous 3 times per day  . insulin aspart  0-20 Units Subcutaneous TID WC  . insulin aspart  0-5 Units Subcutaneous QHS  . insulin detemir  10 Units Subcutaneous QHS  . ipratropium-albuterol  3 mL Nebulization BID  . multivitamin with minerals  1 tablet Oral Daily  . sodium chloride  3 mL Intravenous Q12H     Infusions: . DOBUTamine 5 mcg/kg/min (09/08/14 0700)  . heparin 1,250 Units/hr (09/07/14 2125)  . milrinone 0.125 mcg/kg/min (09/08/14 0700)      Assessment:  1. Acute systolic and diastolic heart failure    --EF 20-25%. Suspect ischemic 2. NSTEMI 3. NSVT  09/07/2014 4. DVT --> RLE DVT on Heparin 5. 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 Anasarca 8.  Blind. 9. Morbid obesity with obesity hypoventilation, BMI 40.8. 10. LLE pain - likely critical limb ischemia 11. Shock - likely cardiogenic +/- sepsis 12. DNR   Plan/Discussion:     Length of Stay: 2  CLEGG,AMY NP-C  09/08/2014, 7:55 AM  Advanced Heart Failure Team Pager [phone] (M-F; 7a - 4p)  Please contact [facility] for night-coverage after hours (4p -7a ) and weekends on [email or web address]  Patient seen and examined with Tina Becket, NP. We discussed all aspects of the encounter. I agree with the assessment and plan as stated above.   She is critically ill with multiple serious issues. BP remains low despite milrinone and dobutamine. I suspect she has cardiogenic shock but given WBC > 20k on admit may also have component of sepsis. Will stop milrinone and dobutamine. Start levophed. Check co-ox and CVPs. Cut lasix back to 80 IV bid until we get a better handle on her hemodynamics. She will need large volume diuresis if we can get her  stabilized. Cardiomyopathy is likely mostly ischemic but may have a critical illness overaly. Not candidate for cath due to renal failure. No ACE or bblockers with hypotension. Will start statin.   LLE looks ischemic and I worry for critical limb ischemia with rest pain. Will get ABIs and ask [facility] to see. Broaden ceftriaxone to Zosyn. May need to add vancomycin.   Continue heparin for DVT.   We will do what we can to help optimize her but I worry the magnitude and severity of her issues may limit how much we can do for her.   The patient is critically ill with multiple organ systems failure and requires high complexity decision making for assessment and support, frequent evaluation and titration of therapies, application of advanced monitoring technologies and extensive interpretation of multiple databases.   Critical Care Time devoted to patient care services described in this note is 45 Minutes.  Daniel Bensimhon,MD 8:49 AM

## 2014-09-08 NOTE — Progress Notes (Signed)
Pt. Was having a lot of intermittent pain in the lower extremeties lt. Foot. Had pain med earlier but now the pain is worse. Lt. Foot cool to touch initally on assessment and discolored heel and plantar foot.  Dorsalis pedis per doopler both feet. Dr. Leatha GildingMallory made aware with orders made. Given another dose of vicodin x1. Will continue to monitor.

## 2014-09-08 NOTE — Progress Notes (Signed)
PT Cancellation Note  Patient Details Name: Tina Atkins MRN: [id number] DOB: 06-Jan-1951   Cancelled Treatment:    Reason Eval/Treat Not Completed: Medical issues which prohibited therapy (pt with acute DVT, PE with non-declining troponin and Heparin not yet therapeutic. Await medical stability)   Toney Sangabor, Ivon Roedel [facility] 09/08/2014, 7:32 AM Delaney MeigsMaija Tabor Zyan Mirkin, PT [phone]

## 2014-09-08 NOTE — Progress Notes (Signed)
Subjective:    Patient continues to report some improvement in her breathing. Her chief complaint currently is her lower extremity edema and pain in her left foot. The pain in her left foot is constant. She denies cough currently. She reports that her edema may actually of gotten a little bit better since admission.  Interval Events: -Echo with ejection fraction of 20-25% yesterday. -Continued poor urine output. -Seen by cardiology yesterday who recommended transferring her to stepdown and starting milrinone along with dobutamine to increase her cardiac output. Heart failure also consulted. -Blood pressure still low this morning with only 500 mL urine output. -Heart failure concerned about cardiogenic shock in the setting of sepsis, switched from milrinone and dobutamine to levophed. -ABIs ordered by heart failure due to concern for ischemia and left lower extremity with no pedal Doppler waveforms, concerning for occlusion. -Vascular surgery consulted by heart failure saw patient is morning and was able to Doppler pulses in distal extremities. -Blood pressure low overnight to 70 systolic, improved this morning after switching to Levophed.   Objective:    Vital Signs:   Temp:  [97.7 F (36.5 C)-98.4 F (36.9 C)] 97.8 F (36.6 C) (04/08 0809) Pulse Rate:  [49-108] 96 (04/08 1100) Resp:  [14-24] 21 (04/08 1100) BP: (70-124)/(41-70) 93/53 mmHg (04/08 1100) SpO2:  [92 %-100 %] 93 % (04/08 1100) Weight:  [222 lb 10.6 oz (101 kg)] 222 lb 10.6 oz (101 kg) (04/08 0600) Last BM Date: 09/24/2014  24-hour weight change: Weight change: 42 lb 10.6 oz (19.352 kg)  Intake/Output:   Intake/Output Summary (Last 24 hours) at 09/08/14 1144 Last data filed at 09/08/14 0800  Gross per 24 hour  Intake 1267.13 ml  Output    525 ml  Net 742.13 ml      Physical Exam: General: Well-developed, well-nourished, in no acute distress; alert, appropriate and cooperative throughout examination. Blind.    Lungs:  Breathing comfortably,   Heart: Tachycardic, JVD to jaw. No murmurs, rubs, or gallops.  Abdomen:  BS normoactive. Soft, non-tender.  Extremities: 3+ pitting edema extending to the abdomen. Open wounds and blisters on bilateral lower extremities very tender to palpation. Lower extremities cool.      Labs:  Basic Metabolic Panel:  Recent Labs Lab 09/07/2014 1942 09/07/14 0307 09/07/14 1910 09/08/14 0648  NA 132* 131*  --  129*  K 4.9 4.5  --  4.5  CL 97 101  --  97  CO2 28 20  --  23  GLUCOSE 321* 280*  --  128*  BUN 44* 46*  --  51*  CREATININE 2.19* 2.20*  --  2.52*  CALCIUM 8.5 8.0*  --  7.8*  MG  --   --  2.1  --     Liver Function Tests:  Recent Labs Lab 09/23/2014 1942  AST 33  ALT 28  ALKPHOS 139*  BILITOT 1.3*  PROT 5.3*  ALBUMIN 2.5*    CBC:  Recent Labs Lab 09/22/2014 1942 09/07/14 0307  WBC 20.6* 16.5*  NEUTROABS 17.3* 14.5*  HGB 15.9* 13.6  HCT 47.0* 40.8  MCV 84.8 84.8  PLT 249 234    Cardiac Enzymes:  Recent Labs Lab 09/07/14 0320 09/07/14 0658 09/08/14 0500  TROPONINI 5.18* 5.76* 3.64*    CBG:  Recent Labs Lab 09/07/14 0318 09/07/14 0557 09/07/14 1132 09/07/14 1626 09/07/14 2125  GLUCAP 250* 209* 97 110* 151*    Coagulation Studies:  Recent Labs  09/02/2014 1942  LABPROT 15.2  INR 1.19  Imaging: Dg Chest 2 View  09/07/2014   CLINICAL DATA:  Difficulty breathing  EXAM: CHEST  2 VIEW  COMPARISON:  09/29/14  FINDINGS: There is a persistent left pleural effusion with left base atelectasis. There is a new small right effusion. Lungs are otherwise clear. Heart is mildly enlarged with pulmonary vascularity within normal limits. No adenopathy. There is degenerative change in the thoracic spine.  IMPRESSION: Persistent left effusion with left base atelectasis. New small right effusion. No change in cardiac silhouette.   Electronically Signed   By: Bretta Bang III M.D.   On: 09/07/2014 09:08   US  Renal  09/08/2014   CLINICAL DATA:  Renal failure.  Hypertension.  Diabetes.  EXAM: RENAL/URINARY TRACT ULTRASOUND COMPLETE  COMPARISON:  None.  FINDINGS: Right Kidney:  Length: 10.4 cm. Echogenicity within normal limits. No mass or hydronephrosis visualized. 6 mm nonobstructing calyceal stone noted .  Left Kidney:  Length: 10.7 cm. Echogenicity within normal limits. No mass or hydronephrosis visualized.  Bladder:  Appears normal for degree of bladder distention.  IMPRESSION: 1. 6 mm nonobstructing calyceal stone right kidney. 2. Otherwise negative exam.   Electronically Signed   By: Maisie Fus  Register   On: 09/08/2014 07:08   Dg Chest Port 1 View  09/08/2014   CLINICAL DATA:  Central line placement  EXAM: PORTABLE CHEST - 1 VIEW  COMPARISON:  09/07/2014  FINDINGS: Interval placement of a left central venous catheter with tip over the cavoatrial junction. No pneumothorax. Cardiac enlargement. Increasing small bilateral pleural effusions and basilar atelectatic changes. No pneumothorax. Mediastinal contours appear intact.  IMPRESSION: Appliances appear in satisfactory position. Cardiac enlargement with increasing pleural effusions and basilar atelectatic changes since previous study.   Electronically Signed   By: Burman Nieves M.D.   On: 09/08/2014 05:44   Dg Chest Portable 1 View  September 29, 2014   CLINICAL DATA:  Shortness of breath. Dry cough for 1 week. Congestion.  EXAM: PORTABLE CHEST - 1 VIEW  COMPARISON:  None.  FINDINGS: Indistinct obscuration left hemidiaphragm, probably from a left pleural effusion with passive atelectasis, although left lower lobe pneumonia is not excluded. The right lung appears clear. Mild enlargement of the cardiopericardial silhouette. Thoracic spondylosis.  IMPRESSION: 1. Hazy obscuration left hemidiaphragm -differential diagnostic considerations include left lower lobe atelectasis, left lower lobe pneumonia, or a layering pleural effusion. 2. Mild enlargement of the  cardiopericardial silhouette.   Electronically Signed   By: Gaylyn Rong M.D.   On: Sep 29, 2014 20:42       Medications:    Infusions: . heparin 1,250 Units/hr (09/07/14 2125)  . norepinephrine (LEVOPHED) Adult infusion 5 mcg/min (09/08/14 0845)    Scheduled Medications: . atorvastatin  80 mg Oral q1800  . clopidogrel  75 mg Oral Daily  . feeding supplement (GLUCERNA SHAKE)  237 mL Oral BID BM  . feeding supplement (PRO-STAT SUGAR FREE 64)  30 mL Oral BID WC  . furosemide  80 mg Intravenous BID  . insulin aspart  0-20 Units Subcutaneous TID WC  . insulin aspart  0-5 Units Subcutaneous QHS  . insulin detemir  10 Units Subcutaneous QHS  . ipratropium-albuterol  3 mL Nebulization BID  . multivitamin with minerals  1 tablet Oral Daily  . piperacillin-tazobactam (ZOSYN)  IV  3.375 g Intravenous 3 times per day  . sodium chloride  3 mL Intravenous Q12H    PRN Medications: albuterol, dextromethorphan-guaiFENesin, fentaNYL, HYDROcodone-acetaminophen   Assessment/ Plan:    Principal Problem:   Acute  combined systolic and diastolic heart failure Active Problems:   AKI (acute kidney injury)   Elevated troponin   Malnutrition   Elevated alkaline phosphatase level   Lactic acid acidosis   Hyperglycemia   Skin breakdown   NSTEMI (non-ST elevated myocardial infarction)  #NSTEMI Per cardiology, she likely completed her infarct at this point. Given her comorbidities, she is not a candidate for PCI or CABG. They recommend medical management, including starting Plavix. Aspirin contraindicated due to history of anaphylaxis. Troponin trending down to 3.64 this morning. Unable to tolerate beta blocker or ACE inhibitor currently. -Continue heparin drip. -Start atorvastatin 80 mg daily. -Clopidogrel 75 mg daily. -Heart diet. -Continue to trend troponins. -Continue to follow cardiology recommendations.  #Acute combined congestive heart failure Low EF with poor urine output even on  dobutamine and milrinone. Likely some contribution of sepsis and multiple other issues. Blood pressure slightly improved after switching to Levophed. Heart failure checking co-ox and CVPs to guide management. Heart failure most likely due to ischemia. -Continue to follow heart failure recommendations. -Lasix 80 mg IV twice a day. -Strict ins and outs. -Fentanyl 25-50 g IV every 4 hours as needed. -Vicodin 5-325 mg every 4 hours as needed. -Dress wounds per wound care recommendations.  #DVT with possible pulmonary embolism Femoral veins cannot be seen on venous duplex. DVT noted in right superficial femoral vein. She may have a pulmonary embolism and as well, but were unable to evaluate for that currently. Management will not change at this point. -Continue heparin drip as above.  #Community acquired pneumonia Ceftriaxone broadened to Zosyn based on heart failure recommendations. No need for atypical coverage currently. Influenza panel negative. Pro-calcitonin elevated, consistent with bacterial pneumonia. -Continue Zosyn IV, day 2. -Stop azithromycin. -Stop droplet precautions. -Continue Mucinex DM. -Trend lactic acid. -DuoNeb's twice a day. -Albuterol nebs every 4 hours when necessary.  #Peripheral vascular disease ABIs unable to be obtained, but pulse is able to be dopplered by vascular surgery. Per vascular surgery PA, possible CO2 angiography. Unable to have a CTA due to renal failure. -Follow-up vascular surgery recommendations.  #Acute versus chronic kidney disease Only 500 mL of urine output admission, most likely cardiorenal. Renal ultrasound with a non-obstructing right kidney stone, but no evidence of hydro. -Discussed with nephrology who will speak with heart failure to determine if she needs to be seen.  #Elevated alkaline phosphatase and total bilirubin We will defer workup until stabilization of heart failure. -Consider right upper quadrant ultrasound.  #Type 2  diabetes Hemoglobin A1c 11.5. -Capillary blood glucose before meals at bedtime with sliding scale insulin resistant. -Levemir 10 units daily at bedtime.  #Blindness Likely secondary to cataracts with questionable glaucoma. -Outpatient workup.  #Malnutrition and failure to thrive Seen by nutrition. -Multivitamin daily. -Glucerna shake twice a day and pro-stat twice a day per nutrition. -Consult social work. -Consult PT and OT when patient is able.   DVT PPX - heparin  CODE STATUS - DNR/DNI  CONSULTS PLACED - Cardiology, Heart Failure, Vascular Surgery.  DISPO - Disposition is deferred at this time, awaiting improvement of multiple medical problems.   The patient does not have a current PCP (Pcp Not In System) and does need an [facility] hospital follow-up appointment after discharge.    Is the [facility] hospital follow-up appointment a one-time only appointment? yes.  Does the patient have transportation limitations that hinder transportation to clinic appointments? yes   SERVICE NEEDED AT DISCHARGE - TO BE DETERMINED DURING HOSPITAL COURSE  Y = Yes, Blank = No PT:   OT:   RN:   Equipment:   Other:      Length of Stay: 2 day(s)   Signed: Donavan FoilEverett J Trashaun Streight, MD  PGY-1, Internal Medicine Resident Pager: [phone] (7AM-5PM) 09/08/2014, 11:44 AM

## 2014-09-08 NOTE — Consult Note (Signed)
Tina Atkins is an 64 y.o. female referred by Dr Heide SparkNarendra   Chief Complaint: Acute renal failure HPI: 63yo WF admitted 09/16/2014 for increasing peripheral edema.  Found to have EF of 20% and HgA1c of 11.5.   Last Scr PTA was 2014 and it was 1 (and albumin 2.9).  On admission, Scr 2.2 and now 2.5.  Renal US unremarkable aside from increased echogenicity.  A UNa was < 10.  Told she had issues with blood sugar 3127yrs ago and lost 50 pounds and thus says she does not have DM.  BS was in the upper 200's on labs in 2014.  Denies hx HTN.  Took ibuprofen (2tabs) and 2 aleve just PTA but denies long term usage.  Developed edema about 1 week PTA and with that blisters of legs.  Past Medical History  Diagnosis Date  . Hypertension   . Diabetes mellitus without complication     prediabetic  . Blind     both eyes  . 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 Fracture of left humerus   . Glaucoma   . Cataract     Past Surgical History  Procedure Laterality Date  . Cesarean section  1987  . Tonsillectomy  1967    Family History  Problem Relation Age of Onset  . Heart disease Father   . Diabetes Father   FH neg for kidney ds  Social History:  reports that she has never smoked. She does not have any smokeless tobacco history on file. She reports that she does not drink alcohol. Her drug history is not on file.  [place] daughter helps with care Allergies:  Allergies  Allergen Reactions  . Bufferin Low Dose [Aspirin] Anaphylaxis    Swelling in face and cough, laryngospasm  . Iodine Nausea And Vomiting    Crab meat  . Neosporin Original [Bacitracin-Neomycin-Polymyxin] Rash    Bacitracin- makes her skin red and rashy  . Sulfites Other (See Comments)    Turns her face purple and feels hot    Medications Prior to Admission  Medication Sig Dispense Refill  . b complex vitamins tablet Take 1 tablet by mouth 2 (two) times a week.    Marland Kitchen. guaiFENesin (MUCINEX) 600 MG  12 hr tablet Take 600 mg by mouth 2 (two) times daily as needed.    Marland Kitchen. ibuprofen (ADVIL,MOTRIN) 200 MG tablet Take 400 mg by mouth every 6 (six) hours as needed for moderate pain.    . naproxen sodium (ANAPROX) 220 MG tablet Take 220 mg by mouth 2 (two) times daily as needed (FOR PAIN).    Marland Kitchen. PE-DM-APAP & Doxylamin-DM-APAP (LIQUID) MISC Take 2 capsules by mouth 2 (two) times daily as needed (FOR COLD).       Lab Results: UA: 7-10 wbc, 3-6 rbc. >300 protein   Recent Labs  09/12/2014 1942 09/07/14 0307  WBC 20.6* 16.5*  HGB 15.9* 13.6  HCT 47.0* 40.8  PLT 249 234   BMET  Recent Labs  09/20/2014 1942 09/07/14 0307 09/08/14 0648  NA 132* 131* 129*  K 4.9 4.5 4.5  CL 97 101 97  CO2 28 20 23   GLUCOSE 321* 280* 128*  BUN 44* 46* 51*  CREATININE 2.19* 2.20* 2.52*  CALCIUM 8.5 8.0* 7.8*   LFT  Recent Labs  09/07/2014 1942  PROT 5.3*  ALBUMIN 2.5*  AST 33  ALT 28  ALKPHOS 139*  BILITOT 1.3*  Dg Chest 2 View  09/07/2014   CLINICAL DATA:  Difficulty breathing  EXAM: CHEST  2 VIEW  COMPARISON:  September 06, 2014  FINDINGS: There is a persistent left pleural effusion with left base atelectasis. There is a new small right effusion. Lungs are otherwise clear. Heart is mildly enlarged with pulmonary vascularity within normal limits. No adenopathy. There is degenerative change in the thoracic spine.  IMPRESSION: Persistent left effusion with left base atelectasis. New small right effusion. No change in cardiac silhouette.   Electronically Signed   By: Bretta Bang III M.D.   On: 09/07/2014 09:08   US Renal  09/08/2014   CLINICAL DATA:  Renal failure.  Hypertension.  Diabetes.  EXAM: RENAL/URINARY TRACT ULTRASOUND COMPLETE  COMPARISON:  None.  FINDINGS: Right Kidney:  Length: 10.4 cm. Echogenicity within normal limits. No mass or hydronephrosis visualized. 6 mm nonobstructing calyceal stone noted .  Left Kidney:  Length: 10.7 cm. Echogenicity within normal limits. No mass or hydronephrosis  visualized.  Bladder:  Appears normal for degree of bladder distention.  IMPRESSION: 1. 6 mm nonobstructing calyceal stone right kidney. 2. Otherwise negative exam.   Electronically Signed   By: Maisie Fus  Register   On: 09/08/2014 07:08   Dg Chest Port 1 View  09/08/2014   CLINICAL DATA:  Central line placement  EXAM: PORTABLE CHEST - 1 VIEW  COMPARISON:  09/07/2014  FINDINGS: Interval placement of a left central venous catheter with tip over the cavoatrial junction. No pneumothorax. Cardiac enlargement. Increasing small bilateral pleural effusions and basilar atelectatic changes. No pneumothorax. Mediastinal contours appear intact.  IMPRESSION: Appliances appear in satisfactory position. Cardiac enlargement with increasing pleural effusions and basilar atelectatic changes since previous study.   Electronically Signed   By: Burman Nieves M.D.   On: 09/08/2014 05:44   Dg Chest Portable 1 View  09/20/2014   CLINICAL DATA:  Shortness of breath. Dry cough for 1 week. Congestion.  EXAM: PORTABLE CHEST - 1 VIEW  COMPARISON:  None.  FINDINGS: Indistinct obscuration left hemidiaphragm, probably from a left pleural effusion with passive atelectasis, although left lower lobe pneumonia is not excluded. The right lung appears clear. Mild enlargement of the cardiopericardial silhouette. Thoracic spondylosis.  IMPRESSION: 1. Hazy obscuration left hemidiaphragm -differential diagnostic considerations include left lower lobe atelectasis, left lower lobe pneumonia, or a layering pleural effusion. 2. Mild enlargement of the cardiopericardial silhouette.   Electronically Signed   By: Gaylyn Rong M.D.   On: 09/07/2014 20:42    ROS: Bind Breathing a little better with O2 No CP No Abd pain Pain in lt leg from blisters/wounds Numbness of 3rd,4th, 5th digits lt hand, chronic No dysuria   PHYSICAL EXAM: Blood pressure 93/53, pulse 96, temperature 97.8 F (36.6 C), temperature source Oral, resp. rate 21, height 5'  1" (1.549 m), weight 101 kg (222 lb 10.6 oz), SpO2 93 %. HEENT: Blind NECK:+ JVD LUNGS:Decreased BS bases with basilar crackles CARDIAC:RRR wo MRG ABD:+ BS NTND no HSM  + sub q edema EXT:4+ edema with superficial pretibial excoriations and blisters.  Lt LE and foot wrapped in bandage.  Distal pulses not palpable and feet cyanotic NEURO:CNI except 2.  No asterixis.  Ox3  Assessment: 1. Acute vs acute on CKD 3/4 (suspect latter) with acute component related to hypotension and poor CO 2. Hypotension sec cardiomyopathy +/- infection 3. PVD 4. DM 5. Volume overload sec cardiomyopathy +/- nephrotic syndrome 6. Severe cardiomyopathy PLAN: 1. Fluid restrict to 1.2l/d 2. Check pr/cr  ratio. 3. Check cortisol 4. SPEP, PO4 5. Serologic studies 6. If the plan is diuresis, she will need higher dose of diuretics.  Will increase to  q 6hr 7. Daily Scr   Warnie Belair T 09/08/2014, 11:51 AM

## 2014-09-08 NOTE — Consult Note (Signed)
Vascular and Vein [facility] Consult  Reason for Consult:  Bilateral lower extremity pain Referring Physician:  Dr. Gala Romney MRN #:  [id number]  History of Present Illness: This is a 64 y.o. female who presented to the [facility] ED with 1.5 week history of shortness of breath and non-productive cough. She also reports a one week history of increasing bilateral leg pain and swelling and recent history (few days ago) of bilateral lower leg blisters. She reports the pain coming from her blisters.  She has also noticed weeping from her blisters. She describes a "pulling" sensation in her calves after she walks short distances in her house. She normally ambulates with a cane. Her ambulation has also been limited by shortness of breath over the past week. She denies any prior history of non-healing wounds. She denies any rest pain. She reports having issues with ambulation in the past month due to "itching at the ankles." She denies any prior intermittent claudication.   The patient is blind. She has a past medical history of hypertension and prediabetes. She currently denies any chest pain.   Past Medical History  Diagnosis Date  . Hypertension   . Diabetes mellitus without complication     prediabetic  . Blind     both eyes  . 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 Fracture of left humerus   . Glaucoma   . Cataract    Past Surgical History  Procedure Laterality Date  . Cesarean section  1987  . Tonsillectomy  1967    Allergies  Allergen Reactions  . Bufferin Low Dose [Aspirin] Anaphylaxis    Swelling in face and cough, laryngospasm  . Iodine Nausea And Vomiting    Crab meat  . Neosporin Original [Bacitracin-Neomycin-Polymyxin] Rash    Bacitracin- makes her skin red and rashy  . Sulfites Other (See Comments)    Turns her face purple and feels hot    Prior to Admission medications   Medication Sig Start Date End Date Taking? Authorizing Provider   b complex vitamins tablet Take 1 tablet by mouth 2 (two) times a week.   Yes Historical Provider, MD  guaiFENesin (MUCINEX) 600 MG 12 hr tablet Take 600 mg by mouth 2 (two) times daily as needed.   Yes Historical Provider, MD  ibuprofen (ADVIL,MOTRIN) 200 MG tablet Take 400 mg by mouth every 6 (six) hours as needed for moderate pain.   Yes Historical Provider, MD  naproxen sodium (ANAPROX) 220 MG tablet Take 220 mg by mouth 2 (two) times daily as needed (FOR PAIN).   Yes Historical Provider, MD  PE-DM-APAP & Doxylamin-DM-APAP (LIQUID) MISC Take 2 capsules by mouth 2 (two) times daily as needed (FOR COLD).   Yes Historical Provider, MD    History   Social History  . Marital Status: Single    Spouse Name: N/A  . Number of Children: N/A  . Years of Education: N/A   Occupational History  . Not on file.   Social History Main Topics  . Smoking status: Never Smoker   . Smokeless tobacco: Not on file  . Alcohol Use: No  . Drug Use: Not on file  . Sexual Activity: Not on file   Other Topics Concern  . Not on file   Social History Narrative   1 daughter (locally), 1 son (lives in [place])   Lives alone, blind   Divorced    Unemployed    Moved here  from [place]   Never had mammogram/colonoscopy and does not want either          Family History  Problem Relation Age of Onset  . Heart disease Father   . Diabetes Father     ROS:  Positive    Negative    All sytems reviewed and are negative  Cardiovascular:  chest pain/pressure  palpitations  SOB lying flat  DOE  pain in legs while walking  pain in legs at rest  pain in legs at night  non-healing ulcers  hx of DVT  swelling in legs  Pulmonary:  productive cough  asthma/wheezing  home O2  Neurologic:  weakness in  arms  legs  numbness in  left hand  legs  hx of CVA  mini stroke difficulty speaking or slurred speech  temporary loss of vision in one eye   dizziness  Hematologic:  hx of cancer  bleeding problems  problems with blood clotting easily  Endocrine:    diabetes  thyroid disease  GI  vomiting blood  blood in stool  GU:  CKD/renal failure  HD--[]  M/W/F or  T/T/S  burning with urination  blood in urine  Psychiatric:  anxiety  depression  Musculoskeletal:  arthritis  joint pain  Integumentary:  rashes  ulcers  Constitutional:  fever  chills   Physical Examination  Filed Vitals:   09/08/14 1234  BP:   Pulse:   Temp: 98 F (36.7 C)  Resp:    Body mass index is 42.09 kg/(m^2).  General:  WDWN in NAD Gait: Not observed HENT: WNL, normocephalic Pulmonary: clear to auscultation, coughs with deep breathing, no increased respiratory effort  Cardiac: regular, without  Murmurs, rubs or gallops Abdomen: soft, NT/ND, no masses Vascular Exam/Pulses: Non palpable femoral pulses and pedal pulses. Biphasic right peroneal and biphasic left dorsalis pedis doppler signals.  Extremities: superficial fluid filled blister to right medial shin, blisters to left lower legs wrapped with serous drainage. No gangrene. Tenderness to palpation of legs bilaterally. Feet slightly cool.  Musculoskeletal: no muscle wasting or atrophy  Neurologic: A&O X 3; Appropriate Affect ; SENSATION: normal; MOTOR FUNCTION:  moving all extremities equally. Speech is fluent/normal   CBC    Component Value Date/Time   WBC 16.5* 09/07/2014 0307   RBC 4.81 09/07/2014 0307   HGB 13.6 09/07/2014 0307   HCT 40.8 09/07/2014 0307   PLT 234 09/07/2014 0307   MCV 84.8 09/07/2014 0307   MCH 28.3 09/07/2014 0307   MCHC 33.3 09/07/2014 0307   RDW 13.7 09/07/2014 0307   LYMPHSABS 0.9 09/07/2014 0307   MONOABS 1.1* 09/07/2014 0307   EOSABS 0.0 09/07/2014 0307   BASOSABS 0.0 09/07/2014 0307    BMET    Component Value Date/Time   NA 129* 09/08/2014 0648   K 4.5 09/08/2014 0648   CL 97 09/08/2014 0648    CO2 23 09/08/2014 0648   GLUCOSE 128* 09/08/2014 0648   BUN 51* 09/08/2014 0648   CREATININE 2.52* 09/08/2014 0648   CALCIUM 7.8* 09/08/2014 0648   GFRNONAA 19* 09/08/2014 0648   GFRAA 22* 09/08/2014 0648    COAGS: Lab Results  Component Value Date   INR 1.19 12-Sep-2014   INR 1.02 01/24/2013    ASSESSMENT: This is a 64 y.o. female with bilateral lower extremity swelling and blisters. Other active problems: NSTEMI, Acute combined CHF, CAP, blindness, possible DVT with PE.   PLAN: The patient's symptoms are not consistent with arterial insufficiency, but rather  from swelling secondary to CHF exacerbation and probably venous insufficiency. She denies any prior history of claudication, rest pain or non healing wounds. She likely has some level of peripheral arterial disease at baseline as evidenced by only single vessel doppler signals bilaterally on physical exam. Her ABIs were not obtainable today, likely due to technician error. Continue wound care. If her symptoms continue to worsen and she develops ischemic changes despite diuresis, may consider angiography with C02 due to acute renal failure. Dr. Edilia Boickson to see patient.   Maris BergerKimberly Trinh, PA-C [facility] Office: [phone] Pager: [phone]  Agree with above. Based on my exam, she has evidence of multilevel arterial occlusive disease. I am unable to palpate femoral pulses. She does have a dopplerable peroneal dorsalis pedis and posterior tibial signal on the right. She has a peroneal and dorsalis pedis signal on the left. She is currently on NEO and weaning her off of vasopressors would certainly help with her perfusion. Regardless, the patient has obesity, congestive heart failure with a ejection fraction of 20%, poorly controlled diabetes (hemoglobin A1c 11.5), acute renal insufficiency, possible NSTEMI, and a right lower extremity DVT. She is short of breath at rest. She is a poor candidate for any further  vascular workup at this point. Given that she has multilevel arterial occlusive disease I do not think a CO2 arteriogram would be especially helpful. We will need to follow the blisters on both legs but at this point there is nothing further to do from a vascular standpoint unless she improves significantly from a medical standpoint. Would continue to try to wean her pressors as possible.  Waverly Ferrarihristopher Lamaya Hyneman, MD, FACS Beeper [phone] Office: [phone]

## 2014-09-08 NOTE — Progress Notes (Signed)
OT Cancellation Note  Patient Details Name: Tina Atkins MRN: [id number] DOB: July 22, 1950   Cancelled Treatment:    Reason Eval/Treat Not Completed: Medical issues which prohibited therapy (Pt with acute DVT, PE awaiting therapeutic heparin level and medical stability.)  Evern BioMayberry, Tina Atkins 09/08/2014, 1:56 PM

## 2014-09-09 DIAGNOSIS — R7989 Other specified abnormal findings of blood chemistry: Secondary | ICD-10-CM

## 2014-09-09 LAB — HEPARIN LEVEL (UNFRACTIONATED)
HEPARIN UNFRACTIONATED: 0.48 [IU]/mL (ref 0.30–0.70)
Heparin Unfractionated: 0.37 IU/mL (ref 0.30–0.70)
Heparin Unfractionated: 0.4 IU/mL (ref 0.30–0.70)

## 2014-09-09 LAB — BASIC METABOLIC PANEL
Anion gap: 13 (ref 5–15)
BUN: 56 mg/dL — ABNORMAL HIGH (ref 6–23)
CO2: 21 mmol/L (ref 19–32)
Calcium: 7.9 mg/dL — ABNORMAL LOW (ref 8.4–10.5)
Chloride: 95 mmol/L — ABNORMAL LOW (ref 96–112)
Creatinine, Ser: 2.67 mg/dL — ABNORMAL HIGH (ref 0.50–1.10)
GFR calc Af Amer: 21 mL/min — ABNORMAL LOW (ref >90)
GFR calc non Af Amer: 18 mL/min — ABNORMAL LOW (ref >90)
GLUCOSE: 184 mg/dL — AB (ref 70–99)
POTASSIUM: 4.6 mmol/L (ref 3.5–5.1)
Sodium: 129 mmol/L — ABNORMAL LOW (ref 135–145)

## 2014-09-09 LAB — GLUCOSE, CAPILLARY
GLUCOSE-CAPILLARY: 145 mg/dL — AB (ref 70–99)
GLUCOSE-CAPILLARY: 138 mg/dL — AB (ref 70–99)
Glucose-Capillary: 192 mg/dL — ABNORMAL HIGH (ref 70–99)
Glucose-Capillary: 118 mg/dL — ABNORMAL HIGH (ref 70–99)

## 2014-09-09 LAB — CBC
HCT: 38.5 % (ref 36.0–46.0)
HEMOGLOBIN: 12.7 g/dL (ref 12.0–15.0)
MCH: 28.1 pg (ref 26.0–34.0)
MCHC: 33 g/dL (ref 30.0–36.0)
MCV: 85.2 fL (ref 78.0–100.0)
PLATELETS: 197 10*3/uL (ref 150–400)
RBC: 4.52 MIL/uL (ref 3.87–5.11)
RDW: 13.7 % (ref 11.5–15.5)
WBC: 14.9 10*3/uL — ABNORMAL HIGH (ref 4.0–10.5)

## 2014-09-09 LAB — CARBOXYHEMOGLOBIN
Carboxyhemoglobin: 1.2 % (ref 0.5–1.5)
METHEMOGLOBIN: 1 % (ref 0.0–1.5)
O2 Saturation: 62.3 %
TOTAL HEMOGLOBIN: 13 g/dL (ref 12.0–16.0)

## 2014-09-09 LAB — BRAIN NATRIURETIC PEPTIDE: B Natriuretic Peptide: 2859.3 pg/mL — ABNORMAL HIGH (ref 0.0–100.0)

## 2014-09-09 MED ORDER — INSULIN ASPART 100 UNIT/ML ~~LOC~~ SOLN
0.0000 [IU] | Freq: Three times a day (TID) | SUBCUTANEOUS | Status: DC
  Administered 2014-09-09 – 2014-09-10 (×3): 3 [IU] via SUBCUTANEOUS

## 2014-09-09 MED ORDER — MILRINONE IN DEXTROSE 20 MG/100ML IV SOLN
0.2500 ug/kg/min | INTRAVENOUS | Status: DC
  Administered 2014-09-09 – 2014-09-10 (×2): 0.125 ug/kg/min via INTRAVENOUS
  Administered 2014-09-10 – 2014-09-13 (×4): 0.25 ug/kg/min via INTRAVENOUS
  Filled 2014-09-09 (×8): qty 100

## 2014-09-09 NOTE — Progress Notes (Signed)
ANTICOAGULATION CONSULT NOTE - Follow Up Consult  Pharmacy Consult for heparin Indication: DVT  Allergies  Allergen Reactions  . Bufferin Low Dose [Aspirin] Anaphylaxis    Swelling in face and cough, laryngospasm  . Iodine Nausea And Vomiting    Crab meat  . Neosporin Original [Bacitracin-Neomycin-Polymyxin] Rash    Bacitracin- makes her skin red and rashy  . Sulfites Other (See Comments)    Turns her face purple and feels hot    Patient Measurements: Height: 5\' 1"  (154.9 cm) Weight: 223 lb 3.2 oz (101.243 kg) IBW/kg (Calculated) : 47.8 Heparin Dosing Weight: 72 kg  Vital Signs: Temp: 97.4 F (36.3 C) (04/09 1600) Temp Source: Oral (04/09 1600) BP: 88/64 mmHg (04/09 1600) Pulse Rate: 85 (04/09 1600)  Labs:  Recent Labs  09/05/2014 1942 09/07/14 0307 09/07/14 0320 09/07/14 [id number]  09/08/14 0500 09/08/14 0648  09/09/14 0112 09/09/14 0509 09/09/14 0607 09/09/14 1517  HGB 15.9* 13.6  --   --   --   --   --   --   --  12.7  --   --   HCT 47.0* 40.8  --   --   --   --   --   --   --  38.5  --   --   PLT 249 234  --   --   --   --   --   --   --  197  --   --   LABPROT 15.2  --   --   --   --   --   --   --   --   --   --   --   INR 1.19  --   --   --   --   --   --   --   --   --   --   --   HEPARINUNFRC  --  0.41  --   --   < >  --   --   < > 0.37  --  0.48 0.40  CREATININE 2.19* 2.20*  --   --   --   --  2.52*  --   --  2.67*  --   --   TROPONINI  --   --  5.18* 5.76*  --  3.64*  --   --   --   --   --   --   < > = values in this interval not displayed.  Estimated Creatinine Clearance: 23.3 mL/min (by C-G formula based on Cr of 2.67).   Medications:  Infusions:  . heparin 1,450 Units/hr (09/09/14 1138)  . milrinone 0.125 mcg/kg/min (09/09/14 1144)  . norepinephrine (LEVOPHED) Adult infusion Stopped (09/09/14 0500)    Assessment: Tina Atkins yof with RLE DVT (per dopplers on 4/7) and NSTEMI (not a cath candidate) continues on IV heparin. Heparin level is therapeutic at  0.4 on 1450 units/hr. No bleeding reported.  Goal of Therapy:  Heparin level 0.3-0.7 units/ml Monitor platelets by anticoagulation protocol: Yes   Plan:  - Continue heparin drip at 1450 units/hr - Daily heparin level and CBC  River Rd Surgery CenterJennifer Portsmouth, CrockettPharm.D., BCPS Clinical Pharmacist Pager: [phone] 09/09/2014 5:49 PM

## 2014-09-09 NOTE — Progress Notes (Signed)
   VASCULAR SURGERY ASSESSMENT & PLAN:  * Multilevel arterial occlusive disease (chronic): Her circulation is improved now that she is off of Neo.  *  Given the patients obesity, congestive heart failure (EF of 20%), poorly controlled diabetes (Hgb A1c 11.5), acute renal insufficiency, possible NSTEMI, and a right lower extremity DVT, she is a poor candidate for any further vascular workup at this point. Given that she has multilevel arterial occlusive disease I do not think a CO2 arteriogram would be especially helpful. We will need to follow the blisters on both legs but at this point there is nothing further to do from a vascular standpoint unless she improves significantly from a medical standpoint.   SUBJECTIVE: Mild left foot pain.  PHYSICAL EXAM: Filed Vitals:   09/09/14 0500 09/09/14 0530 09/09/14 0600 09/09/14 0630  BP: 96/63 91/59 94/55  103/54  Pulse: 73 73 73 84  Temp:      TempSrc:      Resp: 21 15 16 12   Height:      Weight:      SpO2: 96% 92% 95% 97%   Brisk dorsalis pedis signals with the Doppler bilaterally. Persistent bilateral lower extremity swelling.  LABS: Lab Results  Component Value Date   WBC 14.9* 09/09/2014   HGB 12.7 09/09/2014   HCT 38.5 09/09/2014   MCV 85.2 09/09/2014   PLT 197 09/09/2014   Lab Results  Component Value Date   CREATININE 2.52* 09/08/2014   Lab Results  Component Value Date   INR 1.19 09-27-14   CBG (last 3)   Recent Labs  09/08/14 1239 09/08/14 1652 09/08/14 2123  GLUCAP 162* 166* 171*    Principal Problem:   Acute combined systolic and diastolic heart failure Active Problems:   AKI (acute kidney injury)   Elevated troponin   Malnutrition   Elevated alkaline phosphatase level   Lactic acid acidosis   Hyperglycemia   Skin breakdown   NSTEMI (non-ST elevated myocardial infarction)  Cari CarawayChris Fatemah Pourciau Beeper: [phone] 09/09/2014

## 2014-09-09 NOTE — Progress Notes (Signed)
ANTICOAGULATION CONSULT NOTE - Follow Up Consult  Pharmacy Consult for heparin Indication: DVT  Labs:  Recent Labs  02-05-15 1942 09/07/14 0307 09/07/14 0320 09/07/14 0658  09/07/14 1910 09/08/14 0500 09/08/14 0648 09/08/14 1415 09/09/14 0112  HGB 15.9* 13.6  --   --   --   --   --   --   --   --   HCT 47.0* 40.8  --   --   --   --   --   --   --   --   PLT 249 234  --   --   --   --   --   --   --   --   LABPROT 15.2  --   --   --   --   --   --   --   --   --   INR 1.19  --   --   --   --   --   --   --   --   --   HEPARINUNFRC  --  0.41  --   --   < > 0.28*  --   --  0.19* 0.37  CREATININE 2.19* 2.20*  --   --   --   --   --  2.52*  --   --   TROPONINI  --   --  5.18* 5.76*  --   --  3.64*  --   --   --   < > = values in this interval not displayed.    Assessment/Plan:  64yo female therapeutic on heparin after rate changes. Will continue gtt at current rate and confirm stable with am labs.   Vernard GamblesVeronda Aleatha Taite, PharmD, BCPS  09/09/2014,2:26 AM

## 2014-09-09 NOTE — Progress Notes (Signed)
S: breathing better O:BP 102/55 mmHg  Pulse 87  Temp(Src) 97.6 F (36.4 C) (Oral)  Resp 17  Ht  (1.549 m)  Wt 101.243 kg (223 lb 3.2 oz)  BMI 42.20 kg/m2  SpO2 90%  Intake/Output Summary (Last 24 hours) at 09/09/14 1610 Last data filed at 09/09/14 0700  Gross per 24 hour  Intake 1807.4 ml  Output   1375 ml  Net  432.4 ml   Weight change: 0.243 kg (8.6 oz) Gen: Awake and alert RUE:AVWUJ, irreg Resp: decreased BS bases with few crackles Abd: + BS NTND Ext: 4+ edema NEURO:CNI except 2, Ox3 No asterixis   . atorvastatin  80 mg Oral q1800  . clopidogrel  75 mg Oral Daily  . feeding supplement (GLUCERNA SHAKE)  237 mL Oral BID BM  . feeding supplement (PRO-STAT SUGAR FREE 64)  30 mL Oral BID WC  . furosemide  160 mg Intravenous Q6H  . insulin aspart  0-20 Units Subcutaneous TID WC  . insulin aspart  0-5 Units Subcutaneous QHS  . insulin detemir  10 Units Subcutaneous QHS  . ipratropium-albuterol  3 mL Nebulization BID  . multivitamin with minerals  1 tablet Oral Daily  . piperacillin-tazobactam (ZOSYN)  IV  3.375 g Intravenous 3 times per day  . sodium chloride  3 mL Intravenous Q12H   Dg Chest 2 View  09/07/2014   CLINICAL DATA:  Difficulty breathing  EXAM: CHEST  2 VIEW  COMPARISON:  September 18, 2014  FINDINGS: There is a persistent left pleural effusion with left base atelectasis. There is a new small right effusion. Lungs are otherwise clear. Heart is mildly enlarged with pulmonary vascularity within normal limits. No adenopathy. There is degenerative change in the thoracic spine.  IMPRESSION: Persistent left effusion with left base atelectasis. New small right effusion. No change in cardiac silhouette.   Electronically Signed   By: Bretta Bang III M.D.   On: 09/07/2014 09:08   US Renal  09/08/2014   CLINICAL DATA:  Renal failure.  Hypertension.  Diabetes.  EXAM: RENAL/URINARY TRACT ULTRASOUND COMPLETE  COMPARISON:  None.  FINDINGS: Right Kidney:  Length: 10.4 cm.  Echogenicity within normal limits. No mass or hydronephrosis visualized. 6 mm nonobstructing calyceal stone noted .  Left Kidney:  Length: 10.7 cm. Echogenicity within normal limits. No mass or hydronephrosis visualized.  Bladder:  Appears normal for degree of bladder distention.  IMPRESSION: 1. 6 mm nonobstructing calyceal stone right kidney. 2. Otherwise negative exam.   Electronically Signed   By: Maisie Fus  Register   On: 09/08/2014 07:08   Dg Chest Port 1 View  09/08/2014   CLINICAL DATA:  Central line placement  EXAM: PORTABLE CHEST - 1 VIEW  COMPARISON:  09/07/2014  FINDINGS: Interval placement of a left central venous catheter with tip over the cavoatrial junction. No pneumothorax. Cardiac enlargement. Increasing small bilateral pleural effusions and basilar atelectatic changes. No pneumothorax. Mediastinal contours appear intact.  IMPRESSION: Appliances appear in satisfactory position. Cardiac enlargement with increasing pleural effusions and basilar atelectatic changes since previous study.   Electronically Signed   By: Burman Nieves M.D.   On: 09/08/2014 05:44   BMET    Component Value Date/Time   NA 129* 09/09/2014 0509   K 4.6 09/09/2014 0509   CL 95* 09/09/2014 0509   CO2 21 09/09/2014 0509   GLUCOSE 184* 09/09/2014 0509   BUN 56* 09/09/2014 0509   CREATININE 2.67* 09/09/2014 0509   CALCIUM 7.9* 09/09/2014 8119  GFRNONAA 18* 09/09/2014 0509   GFRAA 21* 09/09/2014 0509   CBC    Component Value Date/Time   WBC 14.9* 09/09/2014 0509   RBC 4.52 09/09/2014 0509   HGB 12.7 09/09/2014 0509   HCT 38.5 09/09/2014 0509   PLT 197 09/09/2014 0509   MCV 85.2 09/09/2014 0509   MCH 28.1 09/09/2014 0509   MCHC 33.0 09/09/2014 0509   RDW 13.7 09/09/2014 0509   LYMPHSABS 0.9 09/07/2014 0307   MONOABS 1.1* 09/07/2014 0307   EOSABS 0.0 09/07/2014 0307   BASOSABS 0.0 09/07/2014 0307     Assessment: 1. 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  UO fair, Scr fractionally higher.   Pr/Cr not suggestive of nephrotic syndrome 2. Hypotension sec cardiomyopathy.  Now off pressors 3. PVD 4. DM 5. Vol overload sec severe cardiomyopathy  Plan: 1. Cont diuretics 2. Daily Scr    Perle Gibbon T

## 2014-09-09 NOTE — Progress Notes (Signed)
Subjective:    Pt denies any pain and reports her breathing has improved.      Objective:    Vital Signs:   Temp:  [97.6 F (36.4 C)-98.4 F (36.9 C)] 97.8 F (36.6 C) (04/09 1137) Pulse Rate:  [73-104] 88 (04/09 1245) Resp:  [2-28] 21 (04/09 1245) BP: (81-122)/(51-80) 104/51 mmHg (04/09 1245) SpO2:  [86 %-100 %] 94 % (04/09 1245) Weight:  [101.243 kg (223 lb 3.2 oz)] 101.243 kg (223 lb 3.2 oz) (04/09 0238) Last BM Date: 24-Sep-2014  24-hour weight change: Weight change: 0.243 kg (8.6 oz)  Intake/Output:   Intake/Output Summary (Last 24 hours) at 09/09/14 1303 Last data filed at 09/09/14 1300  Gross per 24 hour  Intake 2198.7 ml  Output   1815 ml  Net  383.7 ml      Physical Exam: General: Well-developed, well-nourished, in no acute distress; alert, appropriate and cooperative throughout examination.   Lungs:  CTAB  Heart: RRR  Abdomen:  BS normoactive. Soft, non-tender.  Extremities: 2-3+ pitting edema b/l extending to the abdomen. LE wounds covered with bandage.       Labs:  Basic Metabolic Panel:  Recent Labs Lab 24-Sep-2014 1942 09/07/14 0307 09/07/14 1910 09/08/14 0648 09/08/14 1540 09/09/14 0509  NA 132* 131*  --  129*  --  129*  K 4.9 4.5  --  4.5  --  4.6  CL 97 101  --  97  --  95*  CO2 28 20  --  23  --  21  GLUCOSE 321* 280*  --  128*  --  184*  BUN 44* 46*  --  51*  --  56*  CREATININE 2.19* 2.20*  --  2.52*  --  2.67*  CALCIUM 8.5 8.0*  --  7.8*  --  7.9*  MG  --   --  2.1  --   --   --   PHOS  --   --   --   --  4.7*  --     Liver Function Tests:  Recent Labs Lab 24-Sep-2014 1942  AST 33  ALT 28  ALKPHOS 139*  BILITOT 1.3*  PROT 5.3*  ALBUMIN 2.5*    CBC:  Recent Labs Lab 24-Sep-2014 1942 09/07/14 0307 09/09/14 0509  WBC 20.6* 16.5* 14.9*  NEUTROABS 17.3* 14.5*  --   HGB 15.9* 13.6 12.7  HCT 47.0* 40.8 38.5  MCV 84.8 84.8 85.2  PLT 249 234 197    Cardiac Enzymes:  Recent Labs Lab 09/07/14 0320 09/07/14 0658  09/08/14 0500  TROPONINI 5.18* 5.76* 3.64*    CBG:  Recent Labs Lab 09/08/14 1239 09/08/14 1652 09/08/14 2123 09/09/14 0743 09/09/14 1135  GLUCAP 162* 166* 171* 192* 145*    Coagulation Studies:  Recent Labs  24-Sep-2014 1942  LABPROT 15.2  INR 1.19     Imaging: [place] Renal  09/08/2014   CLINICAL DATA:  Renal failure.  Hypertension.  Diabetes.  EXAM: RENAL/URINARY TRACT ULTRASOUND COMPLETE  COMPARISON:  None.  FINDINGS: Right Kidney:  Length: 10.4 cm. Echogenicity within normal limits. No mass or hydronephrosis visualized. 6 mm nonobstructing calyceal stone noted .  Left Kidney:  Length: 10.7 cm. Echogenicity within normal limits. No mass or hydronephrosis visualized.  Bladder:  Appears normal for degree of bladder distention.  IMPRESSION: 1. 6 mm nonobstructing calyceal stone right kidney. 2. Otherwise negative exam.   Electronically Signed   By: Maisie Fushomas  Register   On: 09/08/2014 07:08  Dg Chest Port 1 View  09/08/2014   CLINICAL DATA:  Central line placement  EXAM: PORTABLE CHEST - 1 VIEW  COMPARISON:  09/07/2014  FINDINGS: Interval placement of a left central venous catheter with tip over the cavoatrial junction. No pneumothorax. Cardiac enlargement. Increasing small bilateral pleural effusions and basilar atelectatic changes. No pneumothorax. Mediastinal contours appear intact.  IMPRESSION: Appliances appear in satisfactory position. Cardiac enlargement with increasing pleural effusions and basilar atelectatic changes since previous study.   Electronically Signed   By: Burman Nieves M.D.   On: 09/08/2014 05:44       Medications:    Infusions: . heparin 1,450 Units/hr (09/09/14 1138)  . milrinone 0.125 mcg/kg/min (09/09/14 1144)  . norepinephrine (LEVOPHED) Adult infusion Stopped (09/09/14 0500)    Scheduled Medications: . atorvastatin  80 mg Oral q1800  . clopidogrel  75 mg Oral Daily  . feeding supplement (GLUCERNA SHAKE)  237 mL Oral BID BM  . feeding supplement  (PRO-STAT SUGAR FREE 64)  30 mL Oral BID WC  . furosemide  160 mg Intravenous Q6H  . insulin aspart  0-20 Units Subcutaneous TID WC  . insulin aspart  0-5 Units Subcutaneous QHS  . insulin detemir  10 Units Subcutaneous QHS  . ipratropium-albuterol  3 mL Nebulization BID  . multivitamin with minerals  1 tablet Oral Daily  . piperacillin-tazobactam (ZOSYN)  IV  3.375 g Intravenous 3 times per day  . sodium chloride  3 mL Intravenous Q12H    PRN Medications: albuterol, dextromethorphan-guaiFENesin, fentaNYL, HYDROcodone-acetaminophen   Assessment/ Plan:    Principal Problem:   Acute combined systolic and diastolic heart failure Active Problems:   AKI (acute kidney injury)   Elevated troponin   Malnutrition   Elevated alkaline phosphatase level   Lactic acid acidosis   Hyperglycemia   Skin breakdown   NSTEMI (non-ST elevated myocardial infarction)  Acute systolic HF- LVEF 20-25% with grade 2 DD.  Also c/w mild PAH. UOP 1.3L yesterday.  Wt initially 81 kg-->101 kg today.  CVP 16 today.  Renal adjusted lasix yesterday to  IV q6h.  Renal US unrevealing.  BP has improved and was taken off levophed this AM.   -appreciate HF following and recs -cont lasix  IV q6h  -strict I/Os and daily weights   Acute on chronic kidney disease -renal following, cont recs per above  Community acquired pneumonia Strept pneumo positive.  PCT 0.47.  Afebrile.  2V CXR on 4/7 impression of persistent left effusion with atx and new small right.   -cont zosyn, day 3 (for ?wound infection)  DVT with possible pulmonary embolism Doppler revealed DVT in right superficial femoral vein.  -cont heparin gtt   Peripheral vascular disease ABIs unable to be obtained, but pulse is able to be dopplered by vascular surgery. Per vascular surgery PA, possible CO2 angiography. Unable to have a CTA due to renal failure. -vascular-->no further mgmt at this point   Length of Stay: 3  day(s)   Signed: Marrian Salvage, MD  PGY-1, Internal Medicine Resident Pager: [phone] (7AM-5PM) 09/09/2014, 1:03 PM

## 2014-09-09 NOTE — Progress Notes (Signed)
Patient ID: Tina Atkins, female   DOB: [birth year], 64 y.o.   MRN: [id number]   SUBJECTIVE: Doing better today, off norepinephrine last night.  SBP 90s for the most part.  Weight up 1 lb, poor diuresis.  Now on high dose Lasix boluses per nephrology.  Co-ox 62% with CVP 15 today.   Scheduled Meds: . atorvastatin  80 mg Oral q1800  . clopidogrel  75 mg Oral Daily  . feeding supplement (GLUCERNA SHAKE)  237 mL Oral BID BM  . feeding supplement (PRO-STAT SUGAR FREE 64)  30 mL Oral BID WC  . furosemide  160 mg Intravenous Q6H  . insulin aspart  0-20 Units Subcutaneous TID WC  . insulin aspart  0-5 Units Subcutaneous QHS  . insulin detemir  10 Units Subcutaneous QHS  . ipratropium-albuterol  3 mL Nebulization BID  . multivitamin with minerals  1 tablet Oral Daily  . piperacillin-tazobactam (ZOSYN)  IV  3.375 g Intravenous 3 times per day  . sodium chloride  3 mL Intravenous Q12H   Continuous Infusions: . heparin 1,450 Units/hr (09/09/14 0800)  . milrinone    . norepinephrine (LEVOPHED) Adult infusion Stopped (09/09/14 0500)   PRN Meds:.albuterol, dextromethorphan-guaiFENesin, fentaNYL, HYDROcodone-acetaminophen    Filed Vitals:   09/09/14 0900 09/09/14 0950 09/09/14 1000 09/09/14 1027  BP: 109/52  93/57   Pulse: 95 92 92 93  Temp:      TempSrc:      Resp: Height:      Weight:      SpO2: 92% 86% 95% 95%    Intake/Output Summary (Last 24 hours) at 09/09/14 1114 Last data filed at 09/09/14 1000  Gross per 24 hour  Intake 1715.5 ml  Output   1690 ml  Net   25.5 ml    LABS: Basic Metabolic Panel:  Recent Labs  40/98/11 1910 09/08/14 0648 09/08/14 1540 09/09/14 0509  NA  --  129*  --  129*  K  --  4.5  --  4.6  CL  --  97  --  95*  CO2  --  23  --  21  GLUCOSE  --  128*  --  184*  BUN  --  51*  --  56*  CREATININE  --  2.52*  --  2.67*  CALCIUM  --  7.8*  --  7.9*  MG 2.1  --   --   --   PHOS  --   --  4.7*  --    Liver Function Tests:  Recent  Labs  September 24, 2014 1942  AST 33  ALT 28  ALKPHOS 139*  BILITOT 1.3*  PROT 5.3*  ALBUMIN 2.5*   No results for input(s): LIPASE, AMYLASE in the last 72 hours. CBC:  Recent Labs  09/24/14 1942 09/07/14 0307 09/09/14 0509  WBC 20.6* 16.5* 14.9*  NEUTROABS 17.3* 14.5*  --   HGB 15.9* 13.6 12.7  HCT 47.0* 40.8 38.5  MCV 84.8 84.8 85.2  PLT 249 234 197   Cardiac Enzymes:  Recent Labs  09/07/14 0320 09/07/14 0658 09/08/14 0500  TROPONINI 5.18* 5.76* 3.64*   BNP: Invalid input(s): POCBNP D-Dimer:  Recent Labs  Sep 24, 2014 2020  DDIMER 1.22*   Hemoglobin A1C:  Recent Labs  09/24/2014 1942  HGBA1C 11.5*   Fasting Lipid Panel:  Recent Labs  09/08/14 0648  CHOL 177  HDL 36*  LDLCALC 111*  TRIG 149  CHOLHDL 4.9   Thyroid Function Tests:  Recent  Labs  09/08/14 0647  TSH 2.507   Anemia Panel: No results for input(s): VITAMINB12, FOLATE, FERRITIN, TIBC, IRON, RETICCTPCT in the last 72 hours.  RADIOLOGY: Dg Chest 2 View  09/07/2014   CLINICAL DATA:  Difficulty breathing  EXAM: CHEST  2 VIEW  COMPARISON:  September 06, 2014  FINDINGS: There is a persistent left pleural effusion with left base atelectasis. There is a new small right effusion. Lungs are otherwise clear. Heart is mildly enlarged with pulmonary vascularity within normal limits. No adenopathy. There is degenerative change in the thoracic spine.  IMPRESSION: Persistent left effusion with left base atelectasis. New small right effusion. No change in cardiac silhouette.   Electronically Signed   By: Bretta Bang III M.D.   On: 09/07/2014 09:08   US Renal  09/08/2014   CLINICAL DATA:  Renal failure.  Hypertension.  Diabetes.  EXAM: RENAL/URINARY TRACT ULTRASOUND COMPLETE  COMPARISON:  None.  FINDINGS: Right Kidney:  Length: 10.4 cm. Echogenicity within normal limits. No mass or hydronephrosis visualized. 6 mm nonobstructing calyceal stone noted .  Left Kidney:  Length: 10.7 cm. Echogenicity within normal limits.  No mass or hydronephrosis visualized.  Bladder:  Appears normal for degree of bladder distention.  IMPRESSION: 1. 6 mm nonobstructing calyceal stone right kidney. 2. Otherwise negative exam.   Electronically Signed   By: Maisie Fus  Register   On: 09/08/2014 07:08   Dg Chest Port 1 View  09/08/2014   CLINICAL DATA:  Central line placement  EXAM: PORTABLE CHEST - 1 VIEW  COMPARISON:  09/07/2014  FINDINGS: Interval placement of a left central venous catheter with tip over the cavoatrial junction. No pneumothorax. Cardiac enlargement. Increasing small bilateral pleural effusions and basilar atelectatic changes. No pneumothorax. Mediastinal contours appear intact.  IMPRESSION: Appliances appear in satisfactory position. Cardiac enlargement with increasing pleural effusions and basilar atelectatic changes since previous study.   Electronically Signed   By: Burman Nieves M.D.   On: 09/08/2014 05:44   Dg Chest Portable 1 View  09/25/2014   CLINICAL DATA:  Shortness of breath. Dry cough for 1 week. Congestion.  EXAM: PORTABLE CHEST - 1 VIEW  COMPARISON:  None.  FINDINGS: Indistinct obscuration left hemidiaphragm, probably from a left pleural effusion with passive atelectasis, although left lower lobe pneumonia is not excluded. The right lung appears clear. Mild enlargement of the cardiopericardial silhouette. Thoracic spondylosis.  IMPRESSION: 1. Hazy obscuration left hemidiaphragm -differential diagnostic considerations include left lower lobe atelectasis, left lower lobe pneumonia, or a layering pleural effusion. 2. Mild enlargement of the cardiopericardial silhouette.   Electronically Signed   By: Gaylyn Rong M.D.   On: 09/30/2014 20:42    PHYSICAL EXAM General: Chronically-ill appearing. In pain due to LLE HEENT: normal Neck: supple. JVP to jaw.  Cor: PMI nondisplaced. Regular rate & rhythm. No rubs, gallops or murmurs. Lungs: Crackles dependently. Abdomen: soft, nontender, nondistended. No  hepatosplenomegaly. No bruits or masses. Good bowel sounds. Extremities: no cyanosis, clubbing, rash, R and LLE 3+ edema, LLE cool and dusky. Partial thickness skin loss on posterior aspect.  Neuro: alert & orientedx3, blind. moves all 4 extremities w/o difficulty. Affect pleasant  TELEMETRY: Reviewed telemetry pt in [facility]  ASSESSMENT AND PLAN: 1. Acute systolic CHF: EF 45-40%, suspect ischemic cardiomyopathy.  Getting high dose Lasix boluses per nephrology, now off norepinephrine with SBP 90s. CVP 15, co-ox 62%.   Poor diuresis so far.  - Continue Lasix per nephrology.  - Will add milrinone 0.125 mcg/kg/min.  If she  tolerates, may increase to 0.25.  2. CAD: NSTEMI, creatinine up to 5.86.  Not interventional candidate at this time with AKI.  No chest pain. Continue atorvastatin, not on ASA due to h/o anaphylaxis.  She is on heparin gtt and Plavix.  3.NSVT: 09/07/2014.  No beta blocker due to shock.  4. DVT: RLE DVT on Heparin, will eventually transition to coumadin.  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  7.Blind. 8.Morbid obesity: BMI 40.8. 9. PAD: Seen by vascular, suspect multilevel arterial occlusive disease, not interventional candidate at this time.  10. ID: On Zosyn for possible PNA. 11. DNR 12. Needs SNF

## 2014-09-09 NOTE — Evaluation (Signed)
Physical Therapy Evaluation Patient Details Name: Tina Atkins MRN: [id number] DOB: Jul 30, 1950 Today's Date: 09/09/2014   History of Present Illness  64 y/o female with PMH of HTN, prediabetes, total blindness who p/w progressive SOB, cough and LE swelling over the past week. Patient states that she initially developed a cough which was non productive but then noted SOB/DOE which was progressive and she was unable to walk a couple of steps before getting SOB along with progressive LE edema with blisters and weeping. Pt with RLE DVT, PE, NSTEMI  Clinical Impression  Pt very pleasant with very concerning social situation as pt is blind, living at home alone, no DME other than a cane, unable to walk to care for herself and only caregiver is dgtr who lives an hour away. Pt wants to be independent but currently unable to even roll or sit on the side of the bed due to pain in LLE and weakness. Pt with above and below deficits who will benefit from acute therapy to maximize mobility, function, strength, activity tolerance and ROM to decrease burden of care and improve quality of life. Pt educated for bil UE and LE HEP and encouraged to perform throughout the day along with assisting nursing with bed mobility and positioning.    Follow Up Recommendations SNF;Supervision/Assistance - 24 hour    Equipment Recommendations  Wheelchair (measurements PT);3in1 (PT);Wheelchair cushion (measurements PT);Rolling walker with 5" wheels    Recommendations for Other Services OT consult     Precautions / Restrictions Precautions Precautions: Fall Precaution Comments: blind      Mobility  Bed Mobility Overal bed mobility: Needs Assistance Bed Mobility: Rolling Rolling: Max assist         General bed mobility comments: pt able to reach for left rail with bil UE and partially bend knee but max assist to fully rotate trunk and will require 2 people to fully roll. Pt able to use bed rails with HOB elevated  to pull trunk off of surface with minguard  Transfers                 General transfer comment: pt unable at this time secondary to pain and edema  Ambulation/Gait                Stairs            Wheelchair Mobility    Modified Rankin (Stroke Patients Only)       Balance                                             Pertinent Vitals/Pain Pain Assessment: 0-10 Pain Score: 6  Pain Location: LLE with moving or lifting, no pain at rest static Pain Descriptors / Indicators: Stabbing Pain Intervention(s): Limited activity within patient's tolerance;Repositioned  HR 93 sats 93% on 2L BP 93/57 (67)    Home Living Family/patient expects to be discharged to:: Skilled nursing facility Living Arrangements: Alone                    Prior Function Level of Independence: Needs assistance         Comments: pt states she has been living alone for sometime, has not been out of the house since [place]. Dgtr lives an hour away and brings grocery once a week. Pt has 3 steps to enter home but states only able  to walk a few steps less than 10' in the home, sleeps in a chair, had been able to shuffle to bathroom but could not stand from the toilet well and has had declining mobility and function for at least the last month. NO 24 hr care available.     Hand Dominance        Extremity/Trunk Assessment   Upper Extremity Assessment: Generalized weakness;RUE deficits/detail;LUE deficits/detail RUE Deficits / Details: 3/5 strength shoulder flexion & abduction, elbow flexion and extension     LUE Deficits / Details: 3/5 strength shoulder flexion & abduction, elbow flexion and extension   Lower Extremity Assessment: RLE deficits/detail;LLE deficits/detail RLE Deficits / Details: knee flexion 2-/5, knee extension 2/5, hip flexion 2/5, limited dorsiflexion potentially due to edema LLE Deficits / Details: knee flexion 2-/5, knee extension 2-/5, hip  flexion 1/5, limited dorsiflexion potentially due to edema pain and edema limiting all ROM  Cervical / Trunk Assessment: Kyphotic  Communication   Communication: No difficulties  Cognition Arousal/Alertness: Awake/alert Behavior During Therapy: WFL for tasks assessed/performed Overall Cognitive Status: Within Functional Limits for tasks assessed                      General Comments      Exercises General Exercises - Upper Extremity Shoulder Flexion: AROM;Both;10 reps;Supine Elbow Flexion: AROM;Both;10 reps;Supine Elbow Extension: AROM;Supine;Both;10 reps General Exercises - Lower Extremity Short Arc Quad: AAROM;Right;10 reps;Supine Heel Slides: AAROM;Right;Both;10 reps;Supine      Assessment/Plan    PT Assessment Patient needs continued PT services  PT Diagnosis Difficulty walking;Generalized weakness;Acute pain   PT Problem List Decreased strength;Decreased range of motion;Decreased activity tolerance;Decreased balance;Decreased mobility;Pain;Decreased skin integrity;Obesity  PT Treatment Interventions DME instruction;Functional mobility training;Therapeutic activities;Therapeutic exercise;Balance training;Patient/family education   PT Goals (Current goals can be found in the Care Plan section) Acute Rehab PT Goals Patient Stated Goal: be able to walk PT Goal Formulation: With patient Time For Goal Achievement: 09/23/14 Potential to Achieve Goals: Fair    Frequency Min 3X/week   Barriers to discharge Decreased caregiver support      Co-evaluation               End of Session Equipment Utilized During Treatment: Oxygen Activity Tolerance: Patient limited by pain Patient left: in bed;with call bell/phone within reach Nurse Communication: Mobility status;Precautions         Time: 1000-1023 PT Time Calculation (min) (ACUTE ONLY): 23 min   Charges:   PT Evaluation $Initial PT Evaluation Tier I: 1 Procedure PT Treatments $Therapeutic Exercise:  8-22 mins   PT G Codes:        Delorse Lek 09/09/2014, 10:41 AM Delaney Meigs, PT [phone]

## 2014-09-10 LAB — GLUCOSE, CAPILLARY
GLUCOSE-CAPILLARY: 142 mg/dL — AB (ref 70–99)
GLUCOSE-CAPILLARY: 127 mg/dL — AB (ref 70–99)
Glucose-Capillary: 96 mg/dL (ref 70–99)
Glucose-Capillary: 97 mg/dL (ref 70–99)

## 2014-09-10 LAB — BASIC METABOLIC PANEL
ANION GAP: 16 — AB (ref 5–15)
BUN: 56 mg/dL — ABNORMAL HIGH (ref 6–23)
CALCIUM: 7.9 mg/dL — AB (ref 8.4–10.5)
CO2: 20 mmol/L (ref 19–32)
CREATININE: 2.59 mg/dL — AB (ref 0.50–1.10)
Chloride: 95 mmol/L — ABNORMAL LOW (ref 96–112)
GFR calc Af Amer: 21 mL/min — ABNORMAL LOW (ref >90)
GFR calc non Af Amer: 18 mL/min — ABNORMAL LOW (ref >90)
GLUCOSE: 140 mg/dL — AB (ref 70–99)
POTASSIUM: 4.6 mmol/L (ref 3.5–5.1)
Sodium: 131 mmol/L — ABNORMAL LOW (ref 135–145)

## 2014-09-10 LAB — C3 COMPLEMENT: C3 Complement: 127 mg/dL (ref 82–167)

## 2014-09-10 LAB — CARBOXYHEMOGLOBIN
CARBOXYHEMOGLOBIN: 1.1 % (ref 0.5–1.5)
CARBOXYHEMOGLOBIN: 1.3 % (ref 0.5–1.5)
METHEMOGLOBIN: 0.9 % (ref 0.0–1.5)
Methemoglobin: 0.8 % (ref 0.0–1.5)
O2 SAT: 47.3 %
O2 SAT: 61.8 %
TOTAL HEMOGLOBIN: 12.8 g/dL (ref 12.0–16.0)
Total hemoglobin: 12.3 g/dL (ref 12.0–16.0)

## 2014-09-10 LAB — CBC
HEMATOCRIT: 37.8 % (ref 36.0–46.0)
Hemoglobin: 12.5 g/dL (ref 12.0–15.0)
MCH: 28 pg (ref 26.0–34.0)
MCHC: 33.1 g/dL (ref 30.0–36.0)
MCV: 84.6 fL (ref 78.0–100.0)
Platelets: 178 10*3/uL (ref 150–400)
RBC: 4.47 MIL/uL (ref 3.87–5.11)
RDW: 13.9 % (ref 11.5–15.5)
WBC: 12.3 10*3/uL — ABNORMAL HIGH (ref 4.0–10.5)

## 2014-09-10 LAB — HEPARIN LEVEL (UNFRACTIONATED): HEPARIN UNFRACTIONATED: 0.36 [IU]/mL (ref 0.30–0.70)

## 2014-09-10 LAB — CORTISOL: Cortisol, Plasma: 33.3 ug/dL

## 2014-09-10 LAB — BRAIN NATRIURETIC PEPTIDE: B NATRIURETIC PEPTIDE 5: 1923.5 pg/mL — AB (ref 0.0–100.0)

## 2014-09-10 LAB — C4 COMPLEMENT: COMPLEMENT C4, BODY FLUID: 40 mg/dL (ref 14–44)

## 2014-09-10 MED ORDER — OXYMETAZOLINE HCL 0.05 % NA SOLN
1.0000 | Freq: Two times a day (BID) | NASAL | Status: AC | PRN
  Administered 2014-09-10 – 2014-09-12 (×2): 1 via NASAL
  Filled 2014-09-10: qty 15

## 2014-09-10 NOTE — Evaluation (Signed)
Occupational Therapy Evaluation Patient Details Name: Gwenn E Uppal MRN: [id number] DOB: [birth year] Today's Date: 09/10/2014    History of Present Illness 64 y/o female with PMH of HTN, prediabetes, total blindness who p/w progressive SOB, cough and LE swelling over the past week. Patient states that she initially developed a cough which was non productive but then noted SOB/DOE which was progressive and she was unable to walk a couple of steps before getting SOB along with progressive LE edema with blisters and weeping. Pt with RLE DVT, PE, NSTEMI   Clinical Impression   This 64 yo female admitted with above presents to acute OT with increased pain, increased edema (weeping) Bil LEs, decreased mobility, decreased balance, and obesity all affecting her ability to care for herself at a Mod I level at home. She will benefit from acute OT with follow up at SNF.    Follow Up Recommendations  SNF    Equipment Recommendations   (TBD at SNF)       Precautions / Restrictions Precautions Precautions: Fall Precaution Comments: totally blind Restrictions Weight Bearing Restrictions: No      Mobility Bed Mobility               General bed mobility comments: Pt says she just isn't able today due to her leg pain and it is even worse when she has to sit on the EOB due to the lower bed rail "eating" into her thigh          ADL Overall ADL's : Needs assistance/impaired Eating/Feeding: Set up;Supervision/ safety;Bed level (due to blindness)   Grooming: Set up;Supervision/safety;Bed level (due to blindness)   Upper Body Bathing: Supervision/ safety;Set up;Bed level (due to blindness)   Lower Body Bathing: Total assistance;Bed level   Upper Body Dressing : Total assistance;Bed level   Lower Body Dressing: Total assistance;Bed level     Toilet Transfer Details (indicate cue type and reason): unable at this point due to too much pain in LEs (L>R)                  Vision Additional Comments: totally blind           Pertinent Vitals/Pain Pain Assessment: Faces Faces Pain Scale: Hurts whole lot Pain Location: LLE with A to move or lift it (espically the heel per pt) Pain Descriptors / Indicators: Stabbing;Shooting Pain Intervention(s): Limited activity within patient's tolerance;Monitored during session;Repositioned (heel off of bed wtih pillow)     Hand Dominance Right   Extremity/Trunk Assessment Upper Extremity Assessment Upper Extremity Assessment: Generalized weakness;RUE deficits/detail RUE Deficits / Details: 3/5 strength shoulder flexion & abduction, elbow flexion and extension and grip LUE Deficits / Details: 4/5 strength shoulder flexion & abduction, elbow flexion and extension and grip           Communication Communication Communication: No difficulties   Cognition Arousal/Alertness: Awake/alert Behavior During Therapy: WFL for tasks assessed/performed Overall Cognitive Status: Within Functional Limits for tasks assessed                                Home Living Family/patient expects to be discharged to:: Skilled nursing facility Living Arrangements: Alone                                      Prior Functioning/Environment Level of Independence: Needs assistance  Comments: pt states she has been living alone for sometime, has not been out of the house since [place]. Dgtr lives an hour away and brings grocery once a week. Pt has 3 steps to enter home but states only able to walk a few steps less than 10' in the home, sleeps in a chair, had been able to shuffle to bathroom but could not stand from the toilet well and has had declining mobility and function for at least the last month. NO 24 hr care available.    OT Diagnosis: Generalized weakness;Disturbance of vision;Acute pain   OT Problem List: Decreased range of motion;Decreased activity tolerance;Impaired balance (sitting and/or  standing);Pain;Obesity;Impaired vision/perception;Decreased knowledge of use of DME or AE;Increased edema   OT Treatment/Interventions: Self-care/ADL training;Therapeutic exercise;Therapeutic activities;Balance training;Patient/family education;DME and/or AE instruction    OT Goals(Current goals can be found in the care plan section) Acute Rehab OT Goals Patient Stated Goal: to get my LLE better OT Goal Formulation: With patient Time For Goal Achievement: 09/24/14 Potential to Achieve Goals: Good  OT Frequency: Min 2X/week   Barriers to D/C: Decreased caregiver support             End of Session Nurse Communication: Mobility status  Activity Tolerance: Patient limited by pain Patient left: in bed;with call bell/phone within reach   Time: 1206-1223 OT Time Calculation (min): 17 min Charges:  OT General Charges $OT Visit: 1 Procedure OT Evaluation $Initial OT Evaluation Tier I: 1 Procedure  Evette Georges [phone] 09/10/2014, 12:46 PM

## 2014-09-10 NOTE — Progress Notes (Signed)
ANTICOAGULATION CONSULT NOTE - Follow Up Consult  Pharmacy Consult for heparin Indication: DVT  Allergies  Allergen Reactions  . Bufferin Low Dose [Aspirin] Anaphylaxis    Swelling in face and cough, laryngospasm  . Iodine Nausea And Vomiting    Crab meat  . Neosporin Original [Bacitracin-Neomycin-Polymyxin] Rash    Bacitracin- makes her skin red and rashy  . Sulfites Other (See Comments)    Turns her face purple and feels hot    Patient Measurements: Height: 5\' 1"  (154.9 cm) Weight: 222 lb (100.699 kg) IBW/kg (Calculated) : 47.8 Heparin Dosing Weight: 72 kg  Vital Signs: Temp: 98 F (36.7 C) (04/10 0400) Temp Source: Oral (04/10 0400) BP: 99/52 mmHg (04/10 0604) Pulse Rate: 93 (04/10 0604)  Labs:  Recent Labs  09/08/14 0500 09/08/14 0648  09/09/14 0509 09/09/14 0607 09/09/14 1517 09/10/14 0410  HGB  --   --   --  12.7  --   --  12.5  HCT  --   --   --  38.5  --   --  37.8  PLT  --   --   --  197  --   --  178  HEPARINUNFRC  --   --   < >  --  0.48 0.40 0.36  CREATININE  --  2.52*  --  2.67*  --   --  2.59*  TROPONINI 3.64*  --   --   --   --   --   --   < > = values in this interval not displayed.  Estimated Creatinine Clearance: 23.9 mL/min (by C-G formula based on Cr of 2.59).   Medications:  Infusions:  . heparin 1,450 Units/hr (09/09/14 1138)  . milrinone 0.125 mcg/kg/min (09/09/14 1144)  . norepinephrine (LEVOPHED) Adult infusion Stopped (09/09/14 0500)    Assessment: [age] yof with RLE DVT (per dopplers on 4/7) and NSTEMI (not a cath candidate) continues on IV heparin. Heparin level is therapeutic at 0.36 on 1450 units/hr. No bleeding reported, CBC stable.  Goal of Therapy:  Heparin level 0.3-0.7 units/ml Monitor platelets by anticoagulation protocol: Yes   Plan:  - Continue heparin drip at 1450 units/hr - Daily heparin level and CBC  Baila Rouse E. Jaiah Weigel, Pharm.D Clinical Pharmacy Resident Pager: [phone] 09/10/2014 7:14 AM

## 2014-09-10 NOTE — Progress Notes (Signed)
Patient ID: Tina Atkins, female   DOB: 20-Feb-1951, 64 y.o.   MRN: [id number]   SUBJECTIVE: Stable, SBP 90s-100s on milrinone 0.125 and off norepinephrine.  Feeling some better.  Improving UOP but still not very negative.  CVP 14-16.  Initial co-ox today 47%, repeat 61%.    Scheduled Meds: . atorvastatin  80 mg Oral q1800  . clopidogrel  75 mg Oral Daily  . feeding supplement (GLUCERNA SHAKE)  237 mL Oral BID BM  . feeding supplement (PRO-STAT SUGAR FREE 64)  30 mL Oral BID WC  . furosemide  160 mg Intravenous Q6H  . insulin aspart  0-20 Units Subcutaneous TID WC  . insulin aspart  0-5 Units Subcutaneous QHS  . insulin detemir  10 Units Subcutaneous QHS  . ipratropium-albuterol  3 mL Nebulization BID  . multivitamin with minerals  1 tablet Oral Daily  . piperacillin-tazobactam (ZOSYN)  IV  3.375 g Intravenous 3 times per day  . sodium chloride  3 mL Intravenous Q12H   Continuous Infusions: . heparin 1,450 Units/hr (09/10/14 0900)  . milrinone 0.125 mcg/kg/min (09/10/14 0926)  . norepinephrine (LEVOPHED) Adult infusion Stopped (09/09/14 0500)   PRN Meds:.albuterol, dextromethorphan-guaiFENesin, fentaNYL, HYDROcodone-acetaminophen  Filed Vitals:   09/10/14 0604 09/10/14 0700 09/10/14 0758 09/10/14 0809  BP: 99/52  97/55   Pulse: 93 92 96   Temp:   98.2 F (36.8 C)   TempSrc:   Oral   Resp: Height:      Weight:      SpO2: 94% 95% 95% 93%    Intake/Output Summary (Last 24 hours) at 09/10/14 1011 Last data filed at 09/10/14 0925  Gross per 24 hour  Intake 2063.3 ml  Output   2615 ml  Net -551.7 ml    LABS: Basic Metabolic Panel:  Recent Labs  40/98/11 1910  09/08/14 1540 09/09/14 0509 09/10/14 0410  NA  --   < >  --  129* 131*  K  --   < >  --  4.6 4.6  CL  --   < >  --  95* 95*  CO2  --   < >  --  21 20  GLUCOSE  --   < >  --  184* 140*  BUN  --   < >  --  56* 56*  CREATININE  --   < >  --  2.67* 2.59*  CALCIUM  --   < >  --  7.9* 7.9*  MG 2.1   --   --   --   --   PHOS  --   --  4.7*  --   --   < > = values in this interval not displayed. Liver Function Tests: No results for input(s): AST, ALT, ALKPHOS, BILITOT, PROT, ALBUMIN in the last 72 hours. No results for input(s): LIPASE, AMYLASE in the last 72 hours. CBC:  Recent Labs  09/09/14 0509 09/10/14 0410  WBC 14.9* 12.3*  HGB 12.7 12.5  HCT 38.5 37.8  MCV 85.2 84.6  PLT 197 178   Cardiac Enzymes:  Recent Labs  09/08/14 0500  TROPONINI 3.64*   BNP: Invalid input(s): POCBNP D-Dimer: No results for input(s): DDIMER in the last 72 hours. Hemoglobin A1C: No results for input(s): HGBA1C in the last 72 hours. Fasting Lipid Panel:  Recent Labs  09/08/14 0648  CHOL 177  HDL 36*  LDLCALC 111*  TRIG 149  CHOLHDL 4.9   Thyroid Function Tests:  Recent Labs  09/08/14 0647  TSH 2.507   Anemia Panel: No results for input(s): VITAMINB12, FOLATE, FERRITIN, TIBC, IRON, RETICCTPCT in the last 72 hours.  RADIOLOGY: Dg Chest 2 View  09/07/2014   CLINICAL DATA:  Difficulty breathing  EXAM: CHEST  2 VIEW  COMPARISON:  September 06, 2014  FINDINGS: There is a persistent left pleural effusion with left base atelectasis. There is a new small right effusion. Lungs are otherwise clear. Heart is mildly enlarged with pulmonary vascularity within normal limits. No adenopathy. There is degenerative change in the thoracic spine.  IMPRESSION: Persistent left effusion with left base atelectasis. New small right effusion. No change in cardiac silhouette.   Electronically Signed   By: Bretta Bang III M.D.   On: 09/07/2014 09:08   US Renal  09/08/2014   CLINICAL DATA:  Renal failure.  Hypertension.  Diabetes.  EXAM: RENAL/URINARY TRACT ULTRASOUND COMPLETE  COMPARISON:  None.  FINDINGS: Right Kidney:  Length: 10.4 cm. Echogenicity within normal limits. No mass or hydronephrosis visualized. 6 mm nonobstructing calyceal stone noted .  Left Kidney:  Length: 10.7 cm. Echogenicity within  normal limits. No mass or hydronephrosis visualized.  Bladder:  Appears normal for degree of bladder distention.  IMPRESSION: 1. 6 mm nonobstructing calyceal stone right kidney. 2. Otherwise negative exam.   Electronically Signed   By: Maisie Fus  Register   On: 09/08/2014 07:08   Dg Chest Port 1 View  09/08/2014   CLINICAL DATA:  Central line placement  EXAM: PORTABLE CHEST - 1 VIEW  COMPARISON:  09/07/2014  FINDINGS: Interval placement of a left central venous catheter with tip over the cavoatrial junction. No pneumothorax. Cardiac enlargement. Increasing small bilateral pleural effusions and basilar atelectatic changes. No pneumothorax. Mediastinal contours appear intact.  IMPRESSION: Appliances appear in satisfactory position. Cardiac enlargement with increasing pleural effusions and basilar atelectatic changes since previous study.   Electronically Signed   By: Burman Nieves M.D.   On: 09/08/2014 05:44   Dg Chest Portable 1 View  09/08/2014   CLINICAL DATA:  Shortness of breath. Dry cough for 1 week. Congestion.  EXAM: PORTABLE CHEST - 1 VIEW  COMPARISON:  None.  FINDINGS: Indistinct obscuration left hemidiaphragm, probably from a left pleural effusion with passive atelectasis, although left lower lobe pneumonia is not excluded. The right lung appears clear. Mild enlargement of the cardiopericardial silhouette. Thoracic spondylosis.  IMPRESSION: 1. Hazy obscuration left hemidiaphragm -differential diagnostic considerations include left lower lobe atelectasis, left lower lobe pneumonia, or a layering pleural effusion. 2. Mild enlargement of the cardiopericardial silhouette.   Electronically Signed   By: Gaylyn Rong M.D.   On: 09/10/2014 20:42    PHYSICAL EXAM General: Chronically-ill appearing.  HEENT: normal Neck: supple. JVP to jaw.  Cor: PMI nondisplaced. Regular rate & rhythm. No rubs, gallops or murmurs. Lungs: Crackles dependently. Abdomen: soft, nontender, nondistended. No  hepatosplenomegaly. No bruits or masses. Good bowel sounds. Extremities: no cyanosis, clubbing, rash, R and LLE 3+ edema, LLE cooler. Partial thickness skin loss on posterior aspect.  Neuro: alert & orientedx3, blind. moves all 4 extremities w/o difficulty. Affect pleasant  TELEMETRY: Reviewed telemetry pt appears to be in NSR  ASSESSMENT AND PLAN: 1. Acute systolic CHF: EF 16-10%, suspect ischemic cardiomyopathy.  Getting high dose Lasix boluses per nephrology, now on milrinone 0.125 with SBP 90s-100s. CVP 14-16, co-ox 47%=>61%.   Diuresis improving but still not particularly negative.  - Continue Lasix boluses, limit po fluid.  - Will increase milrinone to  0.25 this morning.  - ECG today to confirm sinus rhythm.  2. CAD: NSTEMI, troponin up to 5.86.  Not interventional candidate at this time with AKI.  No chest pain. Continue atorvastatin, not on ASA due to h/o anaphylaxis.  She is on heparin gtt and Plavix.   3.NSVT: 09/07/2014.  No beta blocker due to shock.  4. DVT: RLE DVT on Heparin, add coumadin.   5.Diabetes mellitus type II uncontrolled 6.Renal failure, suspect AKI on CKD. Creatinine mildly lower today.  7.Blind. 8.Morbid obesity: BMI 40.8. 9. PAD: Seen by vascular, suspect multilevel arterial occlusive disease, not interventional candidate at this time. Vascular to followup as outpatient.  10. ID: On Zosyn for possible PNA. 11. DNR 12. Needs SNF  Marca AnconaDalton Addie Cederberg 09/10/2014 10:16 AM

## 2014-09-10 NOTE — Progress Notes (Signed)
Subjective:    The patient continues to report improvement of her breathing. She does continue to note some pain in her ankle that is responding to her pain medications. She reports drinking a lot of water yesterday because she was thirsty. She denies any chest pain or palpitations currently.  Interval Events: -Patient started on milrinone yesterday. -Blood pressure improved to 99/52 on milrinone. -Patient took in greater than 2 L yesterday, but urinated 2.4 L.   Objective:    Vital Signs:   Temp:  [97.4 F (36.3 C)-98.3 F (36.8 C)] 98.2 F (36.8 C) (04/10 0758) Pulse Rate:  [78-100] 96 (04/10 0758) Resp:  [14-24] 17 (04/10 0758) BP: (88-119)/(51-71) 97/55 mmHg (04/10 0758) SpO2:  [91 %-99 %] 93 % (04/10 0809) Weight:  [222 lb (100.699 kg)] 222 lb (100.699 kg) (04/10 0500) Last BM Date: 09/09/14  24-hour weight change: Weight change: -1 lb 3.2 oz (-0.544 kg)  Intake/Output:   Intake/Output Summary (Last 24 hours) at 09/10/14 0954 Last data filed at 09/10/14 [id number]  Gross per 24 hour  Intake 2376.3 ml  Output   2615 ml  Net -238.7 ml      Physical Exam: General: Well-developed, well-nourished, in no acute distress; alert, appropriate and cooperative throughout examination. Blind.  Lungs:  Breathing comfortably, minimal crackles.   Heart: Regular rate, JVD. No murmurs, rubs, or gallops.  Abdomen:  BS normoactive. Soft, non-tender.  Extremities: 3+ pitting edema extending to the abdomen. Open wounds and blisters on bilateral lower extremities very tender to palpation. Lower extremities cool.      Labs:  Basic Metabolic Panel:  Recent Labs Lab 07-27-14 1942 09/07/14 0307 09/07/14 1910 09/08/14 0648 09/08/14 1540 09/09/14 0509 09/10/14 0410  NA 132* 131*  --  129*  --  129* 131*  K 4.9 4.5  --  4.5  --  4.6 4.6  CL 97 101  --  97  --  95* 95*  CO2 28 20  --  23  --  21 20  GLUCOSE 321* 280*  --  128*  --  184* 140*  BUN 44* 46*  --  51*  --  56* 56*    CREATININE 2.19* 2.20*  --  2.52*  --  2.67* 2.59*  CALCIUM 8.5 8.0*  --  7.8*  --  7.9* 7.9*  MG  --   --  2.1  --   --   --   --   PHOS  --   --   --   --  4.7*  --   --     Liver Function Tests:  Recent Labs Lab 07-27-14 1942  AST 33  ALT 28  ALKPHOS 139*  BILITOT 1.3*  PROT 5.3*  ALBUMIN 2.5*    CBC:  Recent Labs Lab 07-27-14 1942 09/07/14 0307 09/09/14 0509 09/10/14 0410  WBC 20.6* 16.5* 14.9* 12.3*  NEUTROABS 17.3* 14.5*  --   --   HGB 15.9* 13.6 12.7 12.5  HCT 47.0* 40.8 38.5 37.8  MCV 84.8 84.8 85.2 84.6  PLT 249 234 197 178    Cardiac Enzymes:  Recent Labs Lab 09/07/14 0320 09/07/14 0658 09/08/14 0500  TROPONINI 5.18* 5.76* 3.64*    CBG:  Recent Labs Lab 09/09/14 0743 09/09/14 1135 09/09/14 1649 09/09/14 2133 09/10/14 0759  GLUCAP 192* 145* 118* 138* 142*    Imaging: No results found.     Medications:    Infusions: . heparin 1,450 Units/hr (09/10/14 0900)  . milrinone 0.125 mcg/kg/min (  09/10/14 0926)  . norepinephrine (LEVOPHED) Adult infusion Stopped (09/09/14 0500)    Scheduled Medications: . atorvastatin  80 mg Oral q1800  . clopidogrel  75 mg Oral Daily  . feeding supplement (GLUCERNA SHAKE)  237 mL Oral BID BM  . feeding supplement (PRO-STAT SUGAR FREE 64)  30 mL Oral BID WC  . furosemide  160 mg Intravenous Q6H  . insulin aspart  0-20 Units Subcutaneous TID WC  . insulin aspart  0-5 Units Subcutaneous QHS  . insulin detemir  10 Units Subcutaneous QHS  . ipratropium-albuterol  3 mL Nebulization BID  . multivitamin with minerals  1 tablet Oral Daily  . piperacillin-tazobactam (ZOSYN)  IV  3.375 g Intravenous 3 times per day  . sodium chloride  3 mL Intravenous Q12H    PRN Medications: albuterol, dextromethorphan-guaiFENesin, fentaNYL, HYDROcodone-acetaminophen   Assessment/ Plan:    Principal Problem:   Acute combined systolic and diastolic heart failure Active Problems:   AKI (acute kidney injury)    Elevated troponin   Malnutrition   Elevated alkaline phosphatase level   Lactic acid acidosis   Hyperglycemia   Skin breakdown   NSTEMI (non-ST elevated myocardial infarction)  #Acute combined congestive heart failure Off of norepinephrine, but started on milrinone yesterday. She appears to be tolerating it well with improved blood pressures. She is diuresing better currently, but she is exceeding her fluid restriction. Discussed with patient, and she will be careful about her fluid intake today. -Continue to follow heart failure recommendations. -Continue Lasix 160 mg IV every 6 hours. -1.2 L fluid restriction. -Strict ins and outs. -Fentanyl 25-50 g IV every 4 hours as needed. -Vicodin 5-325 mg every 4 hours as needed. -Dress wounds per wound care recommendations.  #Acute versus chronic kidney disease Most likely chronic per renal who is following. Urine output has improved. Protein to creatinine ratio not suggestive of nephrotic syndrome. -Appreciate nephrology recommendations. -Continue diuresis as above and monitor creatinine.  #NSTEMI Managing medically. -Continue heparin drip. -Start atorvastatin 80 mg daily. -Clopidogrel 75 mg daily. -Heart diet.  #Superficial femoral vein DVT with possible pulmonary embolism -Continue heparin drip as above.  #Community acquired pneumonia Question of wound infection as well. -Continue Zosyn IV, day 4. -Continue Mucinex DM when necessary. -DuoNeb's twice a day. -Albuterol nebs every 4 hours when necessary.  #Peripheral vascular disease Not a surgical candidate currently. Vascular surgery will arrange to see her as an outpatient in 6-8 weeks. -Outpatient follow-up.  #Elevated alkaline phosphatase and total bilirubin -Consider right upper quadrant ultrasound in future.  #Type 2 diabetes Blood sugars initially improved on current regimen. -Continue capillary blood glucose before meals at bedtime with sliding scale insulin  resistant. -Continue Levemir 10 units daily at bedtime.  #Blindness -Outpatient workup.  #Malnutrition and failure to thrive -Multivitamin daily. -Glucerna shake twice a day and pro-stat twice a day per nutrition. -Consult social work. -Consult PT and OT when patient is able.   DVT PPX - heparin  CODE STATUS - DNR/DNI  CONSULTS PLACED - Cardiology, Heart Failure, Vascular Surgery, Nephrology.  DISPO - Disposition is deferred at this time, awaiting improvement of multiple medical problems.   The patient does not have a current PCP (Pcp Not In System) and does need an [facility] hospital follow-up appointment after discharge.    Is the [facility] hospital follow-up appointment a one-time only appointment? yes.  Does the patient have transportation limitations that hinder transportation to clinic appointments? yes   SERVICE NEEDED AT DISCHARGE - TO BE DETERMINED  DURING HOSPITAL COURSE         Y = Yes, Blank = No PT:   OT:   RN:   Equipment:   Other:      Length of Stay: 4 day(s)   Signed: Donavan Foil, MD  PGY-1, Internal Medicine Resident Pager: [phone] (7AM-5PM) 09/10/2014, 9:54 AM

## 2014-09-10 NOTE — Progress Notes (Signed)
S: Co nasal congestion O:BP 99/52 mmHg  Pulse 92  Temp(Src) 98 F (36.7 C) (Oral)  Resp 14  Ht 5\' 1"  (1.549 m)  Wt 100.699 kg (222 lb)  BMI 41.97 kg/m2  SpO2 95%  Intake/Output Summary (Last 24 hours) at 09/10/14 0746 Last data filed at 09/10/14 0700  Gross per 24 hour  Intake 2182.7 ml  Output   2405 ml  Net -222.3 ml   Weight change: -0.544 kg (-1 lb 3.2 oz) Gen: Awake and alert ZOX:WRUEACVS:irreg, irreg Resp: decreased BS bases with few crackles Abd: + BS NTND Ext: 4+ edema NEURO:CNI except 2, Ox3 No asterixis   . atorvastatin  80 mg Oral q1800  . clopidogrel  75 mg Oral Daily  . feeding supplement (GLUCERNA SHAKE)  237 mL Oral BID BM  . feeding supplement (PRO-STAT SUGAR FREE 64)  30 mL Oral BID WC  . furosemide  160 mg Intravenous Q6H  . insulin aspart  0-20 Units Subcutaneous TID WC  . insulin aspart  0-5 Units Subcutaneous QHS  . insulin detemir  10 Units Subcutaneous QHS  . ipratropium-albuterol  3 mL Nebulization BID  . multivitamin with minerals  1 tablet Oral Daily  . piperacillin-tazobactam (ZOSYN)  IV  3.375 g Intravenous 3 times per day  . sodium chloride  3 mL Intravenous Q12H   No results found. BMET    Component Value Date/Time   NA 131* 09/10/2014 0410   K 4.6 09/10/2014 0410   CL 95* 09/10/2014 0410   CO2 20 09/10/2014 0410   GLUCOSE 140* 09/10/2014 0410   BUN 56* 09/10/2014 0410   CREATININE 2.59* 09/10/2014 0410   CALCIUM 7.9* 09/10/2014 0410   GFRNONAA 18* 09/10/2014 0410   GFRAA 21* 09/10/2014 0410   CBC    Component Value Date/Time   WBC 12.3* 09/10/2014 0410   RBC 4.47 09/10/2014 0410   HGB 12.5 09/10/2014 0410   HCT 37.8 09/10/2014 0410   PLT 178 09/10/2014 0410   MCV 84.6 09/10/2014 0410   MCH 28.0 09/10/2014 0410   MCHC 33.1 09/10/2014 0410   RDW 13.9 09/10/2014 0410   LYMPHSABS 0.9 09/07/2014 0307   MONOABS 1.1* 09/07/2014 0307   EOSABS 0.0 09/07/2014 0307   BASOSABS 0.0 09/07/2014 0307     Assessment: 1. 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  UO good, Scr stable.  Pr/Cr not suggestive of nephrotic syndrome 2. Hypotension sec cardiomyopathy.  Now off pressors 3. PVD 4. DM 5. Vol overload sec severe cardiomyopathy 6. DVT  Plan: 1. Cont diuretics 2. Daily Scr 3. She is suppose to be fluid restricted to 1.2L but this is not happening    Tina Atkins

## 2014-09-10 NOTE — Progress Notes (Signed)
   VASCULAR SURGERY ASSESSMENT & PLAN:  * Chronic multilevel arterial occlusive disease. No further vascular workup planned at this time.  * I will arrange to see her in the office in 6-8 weeks. Vascular surgery will be available as needed.   SUBJECTIVE: complains of nasal congestion.  PHYSICAL EXAM: Filed Vitals:   09/10/14 0500 09/10/14 0600 09/10/14 0604 09/10/14 0700  BP:   99/52   Pulse: 96 92 93 92  Temp:      TempSrc:      Resp: 18 14 20 14   Height:      Weight: 222 lb (100.699 kg)     SpO2: 95% 93% 94% 95%   Both feet are warm and adequately perfused. Persistent moderate bilateral lower extremity swelling with some blisters.  LABS: Lab Results  Component Value Date   WBC 12.3* 09/10/2014   HGB 12.5 09/10/2014   HCT 37.8 09/10/2014   MCV 84.6 09/10/2014   PLT 178 09/10/2014   Lab Results  Component Value Date   CREATININE 2.59* 09/10/2014   Lab Results  Component Value Date   INR 1.19 09/28/2014   CBG (last 3)   Recent Labs  09/09/14 1135 09/09/14 1649 09/09/14 2133  GLUCAP 145* 118* 138*    Principal Problem:   Acute combined systolic and diastolic heart failure Active Problems:   AKI (acute kidney injury)   Elevated troponin   Malnutrition   Elevated alkaline phosphatase level   Lactic acid acidosis   Hyperglycemia   Skin breakdown   NSTEMI (non-ST elevated myocardial infarction)   Tina CarawayChris Axel Atkins Beeper: [phone] 09/10/2014

## 2014-09-11 ENCOUNTER — Other Ambulatory Visit: Payer: Self-pay

## 2014-09-11 DIAGNOSIS — I739 Peripheral vascular disease, unspecified: Secondary | ICD-10-CM

## 2014-09-11 LAB — POCT I-STAT 3, ART BLOOD GAS (G3+)
ACID-BASE DEFICIT: 2 mmol/L (ref 0.0–2.0)
BICARBONATE: 22.6 meq/L (ref 20.0–24.0)
O2 Saturation: 93 %
PH ART: 7.378 (ref 7.350–7.450)
PO2 ART: 65 mmHg — AB (ref 80.0–100.0)
Patient temperature: 97.6
TCO2: 24 mmol/L (ref 0–100)
pCO2 arterial: 38.2 mmHg (ref 35.0–45.0)

## 2014-09-11 LAB — GLUCOSE, CAPILLARY
GLUCOSE-CAPILLARY: 67 mg/dL — AB (ref 70–99)
GLUCOSE-CAPILLARY: 102 mg/dL — AB (ref 70–99)
GLUCOSE-CAPILLARY: 211 mg/dL — AB (ref 70–99)
Glucose-Capillary: 79 mg/dL (ref 70–99)
Glucose-Capillary: 77 mg/dL (ref 70–99)
Glucose-Capillary: 66 mg/dL — ABNORMAL LOW (ref 70–99)
Glucose-Capillary: 77 mg/dL (ref 70–99)

## 2014-09-11 LAB — BASIC METABOLIC PANEL
Anion gap: 10 (ref 5–15)
BUN: 56 mg/dL — ABNORMAL HIGH (ref 6–23)
CHLORIDE: 94 mmol/L — AB (ref 96–112)
CO2: 27 mmol/L (ref 19–32)
CREATININE: 2.66 mg/dL — AB (ref 0.50–1.10)
Calcium: 8 mg/dL — ABNORMAL LOW (ref 8.4–10.5)
GFR calc Af Amer: 21 mL/min — ABNORMAL LOW (ref >90)
GFR calc non Af Amer: 18 mL/min — ABNORMAL LOW (ref >90)
Glucose, Bld: 95 mg/dL (ref 70–99)
Potassium: 4.1 mmol/L (ref 3.5–5.1)
Sodium: 131 mmol/L — ABNORMAL LOW (ref 135–145)

## 2014-09-11 LAB — PROTEIN ELECTROPHORESIS, SERUM
A/G Ratio: 0.7 (ref 0.7–2.0)
ALPHA-1-GLOBULIN: 0.4 g/dL (ref 0.1–0.4)
ALPHA-2-GLOBULIN: 0.8 g/dL (ref 0.4–1.2)
Albumin ELP: 1.6 g/dL — ABNORMAL LOW (ref 3.2–5.6)
BETA GLOBULIN: 0.7 g/dL (ref 0.6–1.3)
GAMMA GLOBULIN: 0.5 g/dL (ref 0.5–1.6)
Globulin, Total: 2.3 g/dL (ref 2.0–4.5)
Total Protein ELP: 3.9 g/dL — ABNORMAL LOW (ref 6.0–8.5)

## 2014-09-11 LAB — CBC
HEMATOCRIT: 35.6 % — AB (ref 36.0–46.0)
HEMOGLOBIN: 11.8 g/dL — AB (ref 12.0–15.0)
MCH: 28.2 pg (ref 26.0–34.0)
MCHC: 33.1 g/dL (ref 30.0–36.0)
MCV: 85 fL (ref 78.0–100.0)
Platelets: 171 10*3/uL (ref 150–400)
RBC: 4.19 MIL/uL (ref 3.87–5.11)
RDW: 14 % (ref 11.5–15.5)
WBC: 10.8 10*3/uL — AB (ref 4.0–10.5)

## 2014-09-11 LAB — LACTIC ACID, PLASMA: Lactic Acid, Venous: 1.3 mmol/L (ref 0.5–2.0)

## 2014-09-11 LAB — HEPARIN LEVEL (UNFRACTIONATED)
Heparin Unfractionated: 0.77 IU/mL — ABNORMAL HIGH (ref 0.30–0.70)
Heparin Unfractionated: 0.67 IU/mL (ref 0.30–0.70)

## 2014-09-11 LAB — CARBOXYHEMOGLOBIN
CARBOXYHEMOGLOBIN: 0.8 % (ref 0.5–1.5)
Methemoglobin: 0.7 % (ref 0.0–1.5)
O2 SAT: 65.9 %
TOTAL HEMOGLOBIN: 11.8 g/dL — AB (ref 12.0–16.0)

## 2014-09-11 LAB — MPO/PR-3 (ANCA) ANTIBODIES
ANCA Proteinase 3: <3.5 U/mL (ref 0.0–3.5)
Myeloperoxidase Abs: <9 U/mL (ref 0.0–9.0)

## 2014-09-11 LAB — BRAIN NATRIURETIC PEPTIDE: B Natriuretic Peptide: 2482.2 pg/mL — ABNORMAL HIGH (ref 0.0–100.0)

## 2014-09-11 MED ORDER — WARFARIN - PHARMACIST DOSING INPATIENT
Freq: Every day | Status: DC
  Administered 2014-09-11 – 2014-09-12 (×2)

## 2014-09-11 MED ORDER — WARFARIN SODIUM 7.5 MG PO TABS
7.5000 mg | ORAL_TABLET | Freq: Once | ORAL | Status: AC
  Administered 2014-09-11: 7.5 mg via ORAL
  Filled 2014-09-11: qty 1

## 2014-09-11 MED ORDER — AMOXICILLIN-POT CLAVULANATE 500-125 MG PO TABS
1.0000 | ORAL_TABLET | Freq: Two times a day (BID) | ORAL | Status: DC
  Administered 2014-09-12 (×2): 500 mg via ORAL
  Filled 2014-09-11 (×4): qty 1

## 2014-09-11 MED ORDER — INSULIN DETEMIR 100 UNIT/ML ~~LOC~~ SOLN: 7.0000 [IU] | Freq: Every day | SUBCUTANEOUS | Status: DC

## 2014-09-11 MED ORDER — COUMADIN BOOK
Freq: Once | Status: AC
  Administered 2014-09-11: 18:00:00
  Filled 2014-09-11: qty 1

## 2014-09-11 MED ORDER — WARFARIN VIDEO
Freq: Once | Status: AC
  Administered 2014-09-11: 14:00:00

## 2014-09-11 MED ORDER — CETYLPYRIDINIUM CHLORIDE 0.05 % MT LIQD
7.0000 mL | Freq: Two times a day (BID) | OROMUCOSAL | Status: DC
Start: 2014-09-12 — End: 2014-09-13
  Administered 2014-09-12 – 2014-09-13 (×3): 7 mL via OROMUCOSAL

## 2014-09-11 MED ORDER — METOLAZONE 5 MG PO TABS
5.0000 mg | ORAL_TABLET | Freq: Two times a day (BID) | ORAL | Status: DC
  Administered 2014-09-11 – 2014-09-13 (×5): 5 mg via ORAL
  Filled 2014-09-11 (×7): qty 1

## 2014-09-11 MED ORDER — INSULIN DETEMIR 100 UNIT/ML ~~LOC~~ SOLN
5.0000 [IU] | Freq: Every day | SUBCUTANEOUS | Status: DC
  Administered 2014-09-11: 5 [IU] via SUBCUTANEOUS
  Filled 2014-09-11 (×3): qty 0.05

## 2014-09-11 MED ORDER — ACETAMINOPHEN 325 MG PO TABS
650.0000 mg | ORAL_TABLET | Freq: Four times a day (QID) | ORAL | Status: DC | PRN
  Administered 2014-09-13: 650 mg via ORAL
  Filled 2014-09-11: qty 2

## 2014-09-11 MED ORDER — HEPARIN (PORCINE) IN NACL 100-0.45 UNIT/ML-% IJ SOLN
1350.0000 [IU]/h | INTRAMUSCULAR | Status: DC
  Administered 2014-09-11 – 2014-09-12 (×3): 1350 [IU]/h via INTRAVENOUS
  Filled 2014-09-11 (×5): qty 250

## 2014-09-11 NOTE — Progress Notes (Signed)
ANTICOAGULATION CONSULT NOTE - Follow Up Consult  Pharmacy Consult for heparin Indication: DVT  Allergies  Allergen Reactions  . Bufferin Low Dose [Aspirin] Anaphylaxis    Swelling in face and cough, laryngospasm  . Iodine Nausea And Vomiting    Crab meat  . Neosporin Original [Bacitracin-Neomycin-Polymyxin] Rash    Bacitracin- makes her skin red and rashy  . Sulfites Other (See Comments)    Turns her face purple and feels hot    Patient Measurements: Height: 5\' 1"  (154.9 cm) Weight: 225 lb (102.059 kg) IBW/kg (Calculated) : 47.8 Heparin Dosing Weight: 72 kg  Vital Signs: Temp: 97.9 F (36.6 C) (04/11 0300) Temp Source: Oral (04/11 0300) BP: 91/61 mmHg (04/11 0000) Pulse Rate: 100 (04/11 0300)  Labs:  Recent Labs  09/09/14 0509  09/09/14 1517 09/10/14 0410 09/11/14 0437  HGB 12.7  --   --  12.5 11.8*  HCT 38.5  --   --  37.8 35.6*  PLT 197  --   --  178 171  HEPARINUNFRC  --   < > 0.40 0.36 0.77*  CREATININE 2.67*  --   --  2.59* 2.66*  < > = values in this interval not displayed.  Estimated Creatinine Clearance: 23.4 mL/min (by C-G formula based on Cr of 2.66).   Medications:  Infusions:  . heparin 1,450 Units/hr (09/11/14 0222)  . milrinone 0.25 mcg/kg/min (09/11/14 0000)  . norepinephrine (LEVOPHED) Adult infusion Stopped (09/09/14 0500)    Assessment: [age] yof with RLE DVT (per dopplers on 4/7) and NSTEMI (not a cath candidate) continues on IV heparin. Heparin level 0.77, slightly above the goal on rate of 1450 units/hr.  RN reports heparin level drawn correctly (not from where heparin infusing).  No bleeding noted.  PLTC 171K stable, Hgb decreased to 11.8.   Goal of Therapy:  Heparin level 0.3-0.7 units/ml Monitor platelets by anticoagulation protocol: Yes   Plan:  Decrease heparin drip to 1350 units/hr 6 hr heparin level  Daily heparin level and CBC   Noah Delaineuth Karla Pavone, RPh Clinical Pharmacist Pager: [phone] 09/11/2014 5:06 AM

## 2014-09-11 NOTE — Progress Notes (Signed)
   Assessment: 1. 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 2. Hypotension sec cardiomyopathy. Now off pressors 3. PVD 4. DM 5. 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 DVT  Plan: 1. Cont diuretics 2. Will check 24 hour urine protein 3. Cont to try to limit fluid in  Subjective: Interval History: fair UOP  Objective: Vital signs in last 24 hours: Temp:  [97.3 F (36.3 C)-98.3 F (36.8 C)] 97.3 F (36.3 C) (04/11 0743) Pulse Rate:  [93-152] 95 (04/11 1000) Resp:  [9-20] 19 (04/11 1000) BP: (78-122)/(44-75) 92/54 mmHg (04/11 1000) SpO2:  [88 %-98 %] 93 % (04/11 1000) Weight:  [102.059 kg (225 lb)] 102.059 kg (225 lb) (04/11 0300) Weight change: 1.361 kg (3 lb)  Intake/Output from previous day: 04/10 0701 - 04/11 0700 In: 1161.3 [P.O.:540; I.V.:489.3; IV Piggyback:132] Out: 1800 [Urine:1800] Intake/Output this shift:    General appearance: te Chest wall: no tenderness GI: soft, non-tender; bowel sounds normal; no masses,  no organomegaly sl protub Extremities: edema tight edema  Lab Results:  Recent Labs  09/10/14 0410 09/11/14 0437  WBC 12.3* 10.8*  HGB 12.5 11.8*  HCT 37.8 35.6*  PLT 178 171   BMET:  Recent Labs  09/10/14 0410 09/11/14 0437  NA 131* 131*  K 4.6 4.1  CL 95* 94*  CO2 20 27  GLUCOSE 140* 95  BUN 56* 56*  CREATININE 2.59* 2.66*  CALCIUM 7.9* 8.0*   No results for input(s): PTH in the last 72 hours. Iron Studies: No results for input(s): IRON, TIBC, TRANSFERRIN, FERRITIN in the last 72 hours. Studies/Results: No results found.  Scheduled: . atorvastatin  80 mg Oral q1800  . clopidogrel  75 mg Oral Daily  . feeding supplement (GLUCERNA SHAKE)  237 mL Oral BID BM  . feeding supplement (PRO-STAT SUGAR FREE 64)  30 mL Oral BID WC  . furosemide  160 mg Intravenous Q6H  . insulin aspart  0-20 Units Subcutaneous TID WC  . insulin aspart  0-5 Units Subcutaneous QHS  . insulin detemir  10 Units Subcutaneous QHS   . ipratropium-albuterol  3 mL Nebulization BID  . metolazone  5 mg Oral BID  . multivitamin with minerals  1 tablet Oral Daily  . piperacillin-tazobactam (ZOSYN)  IV  3.375 g Intravenous 3 times per day  . sodium chloride  3 mL Intravenous Q12H     LOS: 5 days   Danne Vasek C 09/11/2014,10:49 AM

## 2014-09-11 NOTE — Progress Notes (Signed)
Subjective:    The patient appeared to have just woken up for a breathing treatment on my initial exam. She is very drowsy and confused, speaking repetitively about the pain in her ankle. This improved with repeated questioning over the next 10-15 minutes. She denies any dyspnea currently, but she continues of pain in her left ankle that she thinks is due to the skin breakdown.  Interval Events: -Improved fluid restriction yesterday to 1.03 L. Net -339 mL. -Blood pressure and heart rate improved this morning. -Saturating well on 1 L this morning.    Objective:    Vital Signs:   Temp:  [97.3 F (36.3 C)-98.3 F (36.8 C)] 97.3 F (36.3 C) (04/11 0743) Pulse Rate:  [93-152] 95 (04/11 1000) Resp:  [9-20] 19 (04/11 1000) BP: (78-122)/(44-75) 92/54 mmHg (04/11 1000) SpO2:  [88 %-98 %] 93 % (04/11 1000) Weight:  [225 lb (102.059 kg)] 225 lb (102.059 kg) (04/11 0300) Last BM Date: 09/09/14  24-hour weight change: Weight change: 3 lb (1.361 kg)  Intake/Output:   Intake/Output Summary (Last 24 hours) at 09/11/14 1107 Last data filed at 09/11/14 0935  Gross per 24 hour  Intake  818.7 ml  Output   1475 ml  Net -656.3 ml      Physical Exam: General: Well-developed, well-nourished, in no acute distress. Blind, initially confused but AOx3 on repeated questioning.  Lungs:  Breathing comfortably, minimal crackles.   Heart: Regular rate, JVD. No murmurs, rubs, or gallops.  Abdomen:  BS normoactive. Soft, non-tender.  Extremities: 3+ pitting edema extending to the abdomen. Open wounds and blisters on bilateral lower extremities very tender to palpation. Lower extremities cool.      Labs:  Basic Metabolic Panel:  Recent Labs Lab 09/07/14 0307 09/07/14 1910 09/08/14 0648 09/08/14 1540 09/09/14 0509 09/10/14 0410 09/11/14 0437  NA 131*  --  129*  --  129* 131* 131*  K 4.5  --  4.5  --  4.6 4.6 4.1  CL 101  --  97  --  95* 95* 94*  CO2 20  --  23  --  GLUCOSE  280*  --  128*  --  184* 140* 95  BUN 46*  --  51*  --  56* 56* 56*  CREATININE 2.20*  --  2.52*  --  2.67* 2.59* 2.66*  CALCIUM 8.0*  --  7.8*  --  7.9* 7.9* 8.0*  MG  --  2.1  --   --   --   --   --   PHOS  --   --   --  4.7*  --   --   --     Liver Function Tests:  Recent Labs Lab 10/06/14 1942  AST 33  ALT 28  ALKPHOS 139*  BILITOT 1.3*  PROT 5.3*  ALBUMIN 2.5*    CBC:  Recent Labs Lab 10-06-2014 1942 09/07/14 0307 09/09/14 0509 09/10/14 0410 09/11/14 0437  WBC 20.6* 16.5* 14.9* 12.3* 10.8*  NEUTROABS 17.3* 14.5*  --   --   --   HGB 15.9* 13.6 12.7 12.5 11.8*  HCT 47.0* 40.8 38.5 37.8 35.6*  MCV 84.8 84.8 85.2 84.6 85.0  PLT 249 234 197 178 171    Cardiac Enzymes:  Recent Labs Lab 09/07/14 0320 09/07/14 0658 09/08/14 0500  TROPONINI 5.18* 5.76* 3.64*    CBG:  Recent Labs Lab 09/10/14 1230 09/10/14 1629 09/10/14 2107 09/11/14 0741 09/11/14 0941  GLUCAP 127* 96 97 79  77   Lactic Acid, Venous 1.3  ABG    Component Value Date/Time   PHART 7.378 09/11/2014 1030   PCO2ART 38.2 09/11/2014 1030   PO2ART 65.0* 09/11/2014 1030   HCO3 22.6 09/11/2014 1030   TCO2 24 09/11/2014 1030   ACIDBASEDEF 2.0 09/11/2014 1030   O2SAT 93.0 09/11/2014 1030   Imaging: No results found.     Medications:    Infusions: . heparin 1,350 Units/hr (09/11/14 0700)  . milrinone 0.25 mcg/kg/min (09/11/14 0000)  . norepinephrine (LEVOPHED) Adult infusion Stopped (09/09/14 0500)    Scheduled Medications: . atorvastatin  80 mg Oral q1800  . clopidogrel  75 mg Oral Daily  . feeding supplement (GLUCERNA SHAKE)  237 mL Oral BID BM  . feeding supplement (PRO-STAT SUGAR FREE 64)  30 mL Oral BID WC  . furosemide  160 mg Intravenous Q6H  . insulin aspart  0-20 Units Subcutaneous TID WC  . insulin aspart  0-5 Units Subcutaneous QHS  . insulin detemir  10 Units Subcutaneous QHS  . ipratropium-albuterol  3 mL Nebulization BID  . metolazone  5 mg Oral BID  .  multivitamin with minerals  1 tablet Oral Daily  . piperacillin-tazobactam (ZOSYN)  IV  3.375 g Intravenous 3 times per day  . sodium chloride  3 mL Intravenous Q12H    PRN Medications: albuterol, dextromethorphan-guaiFENesin, fentaNYL, oxymetazoline   Assessment/ Plan:    Principal Problem:   Acute combined systolic and diastolic heart failure Active Problems:   AKI (acute kidney injury)   Elevated troponin   Malnutrition   Elevated alkaline phosphatase level   Lactic acid acidosis   Hyperglycemia   Skin breakdown   NSTEMI (non-ST elevated myocardial infarction)  #Acute combined congestive heart failure Very slow progress with diuresis despite improved urine output, and minimal improvement of lower extremity swelling. She has minimal shortness of breath. Seen by heart failure who recommends starting metolozone. CVP 14-16 with improved co-ox. -Continue to follow heart failure recommendations.  -Continue Lasix 160 mg IV every 6 hours. -1.2 L fluid restriction. -Strict ins and outs. -Fentanyl 25-50 g IV every 4 hours as needed. -Continue milrinone 0.25 mcg/kg/m. -Start metolazone 5 mg twice a day. -Dress wounds per wound care recommendations.  #Altered mental status Patient was initially confused on interview, but she improved on repeated questioning. Per the nurse, she has been increasingly drowsy overnight. A stat ABG and lactic acid were collected and found to be normal. This could potentially be due to pain medications. -Stop Vicodin. -Continue to monitor.  #Acute versus chronic kidney disease Creatinine slightly increased this morning, continued improved urine output. Poor progress on diuresis. -Appreciate nephrology recommendations. -Continue diuresis as above and monitor creatinine. -Check 24 hour urine protein per nephrology.  #NSTEMI Managing medically. -Continue heparin drip. -Continue atorvastatin 80 mg daily. -Continue Clopidogrel 75 mg daily. -Heart  diet.  #Superficial femoral vein DVT with possible pulmonary embolism She will need indefinite anticoagulation because there is not a provoking factor for her DVT. -Continue heparin drip as above. -Start coumadin per pharmacy.  #Community acquired pneumonia Question of wound infection as well. -Continue Zosyn IV, day 5. -Continue Mucinex DM when necessary. -DuoNeb's twice a day. -Albuterol nebs every 4 hours when necessary.  #Peripheral vascular disease No signs of ischemia currently. -Outpatient follow-up in 6-8 weeks.  #Elevated alkaline phosphatase and total bilirubin -Consider right upper quadrant ultrasound in future.  #Type 2 diabetes Blood sugars slightly low this morning. -Continue capillary blood glucose before meals at bedtime  with sliding scale insulin resistant. -Reduce Levemir to 5 units daily at bedtime.  #Blindness -Outpatient workup.  #Malnutrition and failure to thrive -Multivitamin daily. -Glucerna shake twice a day and pro-stat twice a day per nutrition. -Consult social work. -Consult PT and OT when patient is able.   DVT PPX - heparin  CODE STATUS - DNR/DNI  CONSULTS PLACED - Cardiology, Heart Failure, Vascular Surgery, Nephrology.  DISPO - Disposition is deferred at this time, awaiting improvement of multiple medical problems.   The patient does not have a current PCP (Pcp Not In System) and does need an [facility] hospital follow-up appointment after discharge.    Is the [facility] hospital follow-up appointment a one-time only appointment? yes.  Does the patient have transportation limitations that hinder transportation to clinic appointments? yes   SERVICE NEEDED AT DISCHARGE - TO BE DETERMINED DURING HOSPITAL COURSE         Y = Yes, Blank = No PT:   OT:   RN:   Equipment:   Other:      Length of Stay: 5 day(s)   Signed: Donavan Foil, MD  PGY-1, Internal Medicine Resident Pager: [phone] (7AM-5PM) 09/11/2014, 11:07 AM

## 2014-09-11 NOTE — Progress Notes (Signed)
Patient seen and examined. Case d/w residents in detail. I agree with findings and plan as documented in Dr. Francella SolianModing's note.   Patient was drowsy and mildly confused when we saw her this AM. She improved over the next 10 minutes. She feels better with decreased SOB but has persistent pain in left ankle.  Heart failure f/u appreciated. Will c/w milrinone gtt, high dose lasix and started on metolazone today. Strict I's and O's and PO fluid restriction. Will monitor in SDU for now. Pt now off pressors but BP remains borderline. Will monitor.  Patient with mildly worsening Cr today. Nephro f/u appreciated. C/w diuresis and monitor.  Will transition patient off heparin gtt to warfarin for DVT.   Patient remains on zosyn for possible wound infection and PNA coverage. Leukocytosis improving. Would consider transition to PO augmentin in the AM

## 2014-09-11 NOTE — Progress Notes (Signed)
ANTICOAGULATION CONSULT NOTE - Follow Up Consult  Pharmacy Consult for Heparin; add Coumadin Indication: DVT  Allergies  Allergen Reactions  . Bufferin Low Dose [Aspirin] Anaphylaxis    Swelling in face and cough, laryngospasm  . Iodine Nausea And Vomiting    Crab meat  . Neosporin Original [Bacitracin-Neomycin-Polymyxin] Rash    Bacitracin- makes her skin red and rashy  . Sulfites Other (See Comments)    Turns her face purple and feels hot    Patient Measurements: Height: 5\' 1"  (154.9 cm) Weight: 225 lb (102.059 kg) IBW/kg (Calculated) : 47.8 Heparin Dosing Weight: 72kg  Vital Signs: Temp: 97.6 F (36.4 C) (04/11 1150) Temp Source: Oral (04/11 1150) BP: 82/41 mmHg (04/11 1150) Pulse Rate: 101 (04/11 1150)  Labs:  Recent Labs  09/09/14 0509  09/10/14 0410 09/11/14 0437 09/11/14 1330  HGB 12.7  --  12.5 11.8*  --   HCT 38.5  --  37.8 35.6*  --   PLT 197  --  178 171  --   HEPARINUNFRC  --   < > 0.36 0.77* 0.67  CREATININE 2.67*  --  2.59* 2.66*  --   < > = values in this interval not displayed.  Estimated Creatinine Clearance: 23.4 mL/min (by C-G formula based on Cr of 2.66).   Medications:  Heparin @ 1350 units/hr  Assessment: [age] yof with RLE DVT (per dopplers on 4/7) and NSTEMI (not a cath candidate) continues on IV heparin. Heparin level is therapeutic after rate decrease this morning. She will also begin coumadin today and will need at least a 5 day overlap with heparin. Today is day #1.  Coumadin score = 5, baseline INR = 1.19.   Goal of Therapy:  INR 2-3 Heparin level 0.3-0.7 units/ml Monitor platelets by anticoagulation protocol: Yes   Plan:  1) Continue heparin at 1350 units/hr 2) Coumadin 7.5mg  x 1 3) Coumadin book and video 4) Daily heparin level, INR, CBC  Fredrik RiggerMarkle, Naomi Castrogiovanni Sue 09/11/2014,2:40 PM

## 2014-09-11 NOTE — Progress Notes (Signed)
Patient ID: Tina Atkins, female   DOB: [birth year], 64 y.o.   MRN: [id number]   SUBJECTIVE:   On milrinone at 0.25. Co-ox up to 66%. Creatinine stable but still not diuresing well despite high-dose lasix.   Denies dyspnea. Foot pain better.   Scheduled Meds: . atorvastatin  80 mg Oral q1800  . clopidogrel  75 mg Oral Daily  . feeding supplement (GLUCERNA SHAKE)  237 mL Oral BID BM  . feeding supplement (PRO-STAT SUGAR FREE 64)  30 mL Oral BID WC  . furosemide  160 mg Intravenous Q6H  . insulin aspart  0-20 Units Subcutaneous TID WC  . insulin aspart  0-5 Units Subcutaneous QHS  . insulin detemir  10 Units Subcutaneous QHS  . ipratropium-albuterol  3 mL Nebulization BID  . multivitamin with minerals  1 tablet Oral Daily  . piperacillin-tazobactam (ZOSYN)  IV  3.375 g Intravenous 3 times per day  . sodium chloride  3 mL Intravenous Q12H   Continuous Infusions: . heparin 1,350 Units/hr (09/11/14 0518)  . milrinone 0.25 mcg/kg/min (09/11/14 0000)  . norepinephrine (LEVOPHED) Adult infusion Stopped (09/09/14 0500)   PRN Meds:.albuterol, dextromethorphan-guaiFENesin, fentaNYL, HYDROcodone-acetaminophen, oxymetazoline  Filed Vitals:   09/10/14 1937 09/10/14 2300 09/11/14 0000 09/11/14 0300  BP:  78/44 91/61   Pulse: 97 100 100 100  Temp: 97.7 F (36.5 C) 98 F (36.7 C)  97.9 F (36.6 C)  TempSrc: Oral Oral  Oral  Resp: 9 17 17 17   Height:      Weight:    102.059 kg (225 lb)  SpO2: 96% 93% 93% 95%    Intake/Output Summary (Last 24 hours) at 09/11/14 0656 Last data filed at 09/11/14 0000  Gross per 24 hour  Intake 1053.9 ml  Output   1375 ml  Net -321.1 ml    LABS: Basic Metabolic Panel:  Recent Labs  40/98/1103/01/16 1540  09/10/14 0410 09/11/14 0437  NA  --   < > 131* 131*  K  --   < > 4.6 4.1  CL  --   < > 95* 94*  CO2  --   < > 20 27  GLUCOSE  --   < > 140* 95  BUN  --   < > 56* 56*  CREATININE  --   < > 2.59* 2.66*  CALCIUM  --   < > 7.9* 8.0*  PHOS 4.7*   --   --   --   < > = values in this interval not displayed. Liver Function Tests: No results for input(s): AST, ALT, ALKPHOS, BILITOT, PROT, ALBUMIN in the last 72 hours. No results for input(s): LIPASE, AMYLASE in the last 72 hours. CBC:  Recent Labs  09/10/14 0410 09/11/14 0437  WBC 12.3* 10.8*  HGB 12.5 11.8*  HCT 37.8 35.6*  MCV 84.6 85.0  PLT 178 171   Cardiac Enzymes: No results for input(s): CKTOTAL, CKMB, CKMBINDEX, TROPONINI in the last 72 hours. BNP: Invalid input(s): POCBNP D-Dimer: No results for input(s): DDIMER in the last 72 hours. Hemoglobin A1C: No results for input(s): HGBA1C in the last 72 hours. Fasting Lipid Panel: No results for input(s): CHOL, HDL, LDLCALC, TRIG, CHOLHDL, LDLDIRECT in the last 72 hours. Thyroid Function Tests: No results for input(s): TSH, T4TOTAL, T3FREE, THYROIDAB in the last 72 hours.  Invalid input(s): FREET3 Anemia Panel: No results for input(s): VITAMINB12, FOLATE, FERRITIN, TIBC, IRON, RETICCTPCT in the last 72 hours.  RADIOLOGY: Dg Chest 2 View  09/07/2014   CLINICAL  DATA:  Difficulty breathing  EXAM: CHEST  2 VIEW  COMPARISON:  10-05-2014  FINDINGS: There is a persistent left pleural effusion with left base atelectasis. There is a new small right effusion. Lungs are otherwise clear. Heart is mildly enlarged with pulmonary vascularity within normal limits. No adenopathy. There is degenerative change in the thoracic spine.  IMPRESSION: Persistent left effusion with left base atelectasis. New small right effusion. No change in cardiac silhouette.   Electronically Signed   By: Bretta Bang III M.D.   On: 09/07/2014 09:08   US Renal  09/08/2014   CLINICAL DATA:  Renal failure.  Hypertension.  Diabetes.  EXAM: RENAL/URINARY TRACT ULTRASOUND COMPLETE  COMPARISON:  None.  FINDINGS: Right Kidney:  Length: 10.4 cm. Echogenicity within normal limits. No mass or hydronephrosis visualized. 6 mm nonobstructing calyceal stone noted .   Left Kidney:  Length: 10.7 cm. Echogenicity within normal limits. No mass or hydronephrosis visualized.  Bladder:  Appears normal for degree of bladder distention.  IMPRESSION: 1. 6 mm nonobstructing calyceal stone right kidney. 2. Otherwise negative exam.   Electronically Signed   By: Maisie Fus  Register   On: 09/08/2014 07:08   Dg Chest Port 1 View  09/08/2014   CLINICAL DATA:  Central line placement  EXAM: PORTABLE CHEST - 1 VIEW  COMPARISON:  09/07/2014  FINDINGS: Interval placement of a left central venous catheter with tip over the cavoatrial junction. No pneumothorax. Cardiac enlargement. Increasing small bilateral pleural effusions and basilar atelectatic changes. No pneumothorax. Mediastinal contours appear intact.  IMPRESSION: Appliances appear in satisfactory position. Cardiac enlargement with increasing pleural effusions and basilar atelectatic changes since previous study.   Electronically Signed   By: Burman Nieves M.D.   On: 09/08/2014 05:44   Dg Chest Portable 1 View  Oct 05, 2014   CLINICAL DATA:  Shortness of breath. Dry cough for 1 week. Congestion.  EXAM: PORTABLE CHEST - 1 VIEW  COMPARISON:  None.  FINDINGS: Indistinct obscuration left hemidiaphragm, probably from a left pleural effusion with passive atelectasis, although left lower lobe pneumonia is not excluded. The right lung appears clear. Mild enlargement of the cardiopericardial silhouette. Thoracic spondylosis.  IMPRESSION: 1. Hazy obscuration left hemidiaphragm -differential diagnostic considerations include left lower lobe atelectasis, left lower lobe pneumonia, or a layering pleural effusion. 2. Mild enlargement of the cardiopericardial silhouette.   Electronically Signed   By: Gaylyn Rong M.D.   On: Oct 05, 2014 20:42    PHYSICAL EXAM General: Chronically-ill appearing.  HEENT: normal Neck: supple. JVP to jaw.  Cor: PMI nondisplaced. Regular rate & rhythm. No rubs, gallops or murmurs. Lungs: Crackles  dependently. Abdomen: soft, nontender, nondistended. No hepatosplenomegaly. No bruits or masses. Good bowel sounds. Extremities: no cyanosis, clubbing, rash, R and LLE 3+ edema, LLE cooler. Partial thickness skin loss on posterior aspect.  Neuro: alert & orientedx3, blind. moves all 4 extremities w/o difficulty. Affect pleasant  TELEMETRY: Reviewed telemetry NSR 90s  ASSESSMENT AND PLAN: 1. Acute systolic CHF: EF 40-98%, suspect ischemic cardiomyopathy.  Getting high dose Lasix boluses per nephrology, now on milrinone 0.25 with SBP 90s-100s. CVP 14-16, co-ox improved but diuresis remains sluggish despite high-does lasix. - Continue milrinone and will add metolazone 2. CAD: NSTEMI, troponin up to 5.86.  Not interventional candidate at this time with AKI.  No chest pain. Continue atorvastatin, not on ASA due to h/o anaphylaxis.  She is on heparin gtt and Plavix.   3.NSVT: 09/07/2014.  No beta blocker due to shock.  4. DVT:  RLE DVT on Heparin. Loading coumadin.   5.Diabetes mellitus type II uncontrolled 6.Renal failure, suspect AKI on CKD. Creatinine relatively stable. 7.Blind. 8.Morbid obesity: BMI 40.8. 9. PAD: Seen by vascular, suspect multilevel arterial occlusive disease, not interventional candidate at this time. Vascular to followup as outpatient.  10. ID: On Zosyn for possible PNA. 11. DNR  Overall prognosis remains quite poor. Still with marked volume overload. Will add metolazone to attempt to facilitate diuresis.   Arvilla Meres MD 09/11/2014 6:56 AM

## 2014-09-11 NOTE — Progress Notes (Signed)
ANTIBIOTIC CONSULT NOTE - INITIAL  Pharmacy Consult for Augmentin Indication: wound infection and PNA  Allergies  Allergen Reactions  . Bufferin Low Dose [Aspirin] Anaphylaxis    Swelling in face and cough, laryngospasm  . Iodine Nausea And Vomiting    Crab meat  . Neosporin Original [Bacitracin-Neomycin-Polymyxin] Rash    Bacitracin- makes her skin red and rashy  . Sulfites Other (See Comments)    Turns her face purple and feels hot    Patient Measurements: Height:  (154.9 cm) Weight: 225 lb (102.059 kg) IBW/kg (Calculated) : 47.8  Vital Signs: Temp: 97.5 F (36.4 C) (04/11 1900) Temp Source: Oral (04/11 1900) BP: 119/75 mmHg (04/11 1800) Pulse Rate: 99 (04/11 1900) Intake/Output from previous day: 04/10 0701 - 04/11 0700 In: 1161.3 [P.O.:540; I.V.:489.3; IV Piggyback:132] Out: 1800 [Urine:1800] Intake/Output from this shift:    Labs:  Recent Labs  09/09/14 0509 09/10/14 0410 09/11/14 0437  WBC 14.9* 12.3* 10.8*  HGB 12.7 12.5 11.8*  PLT 197 178 171  CREATININE 2.67* 2.59* 2.66*   Estimated Creatinine Clearance: 23.4 mL/min (by C-G formula based on Cr of 2.66). No results for input(s): VANCOTROUGH, VANCOPEAK, VANCORANDOM, GENTTROUGH, GENTPEAK, GENTRANDOM, TOBRATROUGH, TOBRAPEAK, TOBRARND, AMIKACINPEAK, AMIKACINTROU, AMIKACIN in the last 72 hours.   Microbiology: Recent Results (from the past 720 hour(s))  Blood culture (routine x 2)     Status: None (Preliminary result)   Collection Time: 09/23/2014  8:50 PM  Result Value Ref Range Status   Specimen Description BLOOD RIGHT ANTECUBITAL  Final   Special Requests BOTTLES DRAWN AEROBIC AND ANAEROBIC 5CC EACH  Final   Culture   Final           BLOOD CULTURE RECEIVED NO GROWTH TO DATE CULTURE WILL BE HELD FOR 5 DAYS BEFORE ISSUING A FINAL NEGATIVE REPORT Performed at [facility]    Report Status PENDING  Incomplete  Blood culture (routine x 2)     Status: None (Preliminary result)   Collection  Time: 09/22/2014  9:00 PM  Result Value Ref Range Status   Specimen Description BLOOD RIGHT HAND  Final   Special Requests BOTTLES DRAWN AEROBIC ONLY 4CC  Final   Culture   Final           BLOOD CULTURE RECEIVED NO GROWTH TO DATE CULTURE WILL BE HELD FOR 5 DAYS BEFORE ISSUING A FINAL NEGATIVE REPORT Performed at [facility]    Report Status PENDING  Incomplete  Urine culture     Status: None   Collection Time: 09/07/14  3:49 AM  Result Value Ref Range Status   Specimen Description URINE, RANDOM  Final   Special Requests NONE  Final   Colony Count   Final    35,000 COLONIES/ML Performed at [facility]    Culture   Final    Multiple bacterial morphotypes present, none predominant. Suggest appropriate recollection if clinically indicated. Performed at [facility]    Report Status 09/08/2014 FINAL  Final  MRSA PCR Screening     Status: None   Collection Time: 09/07/14  6:11 PM  Result Value Ref Range Status   MRSA by PCR NEGATIVE NEGATIVE Final    Comment:        The GeneXpert MRSA Assay (FDA approved for NASAL specimens only), is one component of a comprehensive MRSA colonization surveillance program. It is not intended to diagnose MRSA infection nor to guide or monitor treatment for MRSA infections.     Medical History: Past  Medical History  Diagnosis Date  . Hypertension   . Diabetes mellitus without complication     prediabetic  . Blind     both eyes  . 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 Fracture of left humerus   . Glaucoma   . Cataract     Medications:  Prescriptions prior to admission  Medication Sig Dispense Refill Last Dose  . b complex vitamins tablet Take 1 tablet by mouth 2 (two) times a week.   09/08/2014 at Unknown time  . guaiFENesin (MUCINEX) 600 MG 12 hr tablet Take 600 mg by mouth 2 (two) times daily as needed.   Past Week at Unknown time  . ibuprofen (ADVIL,MOTRIN) 200 MG tablet Take 400 mg by  mouth every 6 (six) hours as needed for moderate pain.   09/24/2014 at Unknown time  . naproxen sodium (ANAPROX) 220 MG tablet Take 220 mg by mouth 2 (two) times daily as needed (FOR PAIN).   09/05/2014 at Unknown time  . PE-DM-APAP & Doxylamin-DM-APAP (LIQUID) MISC Take 2 capsules by mouth 2 (two) times daily as needed (FOR COLD).   Past Week at Unknown time   Assessment: 64 y.o. female well known to pharmacy from anticoagulation and antibiotic dosing. Pt on Day #4 of Zosyn for LLE wound infection and strep pneumo PNA. Noted Zosyn to d/c after today's doses and to change to Augmentin starting 4/12. SCr 2.66, est CrCl 20 ml/min. Afebrile, WBC trending down to 10.   Goal of Therapy:  Resolution of infection  Plan:  Augmentin 500-125 mg po q12h (to start 4/12) Will f/u renal function, micro data and LOT  Christoper Fabianaron Royce Stegman, PharmD, BCPS Clinical pharmacist, pager [phone] 09/11/2014,8:16 PM

## 2014-09-12 ENCOUNTER — Telehealth: Payer: Self-pay | Admitting: Vascular Surgery

## 2014-09-12 DIAGNOSIS — R6251 Failure to thrive (child): Secondary | ICD-10-CM

## 2014-09-12 DIAGNOSIS — I82419 Acute embolism and thrombosis of unspecified femoral vein: Secondary | ICD-10-CM

## 2014-09-12 LAB — CBC
HEMATOCRIT: 35.6 % — AB (ref 36.0–46.0)
Hemoglobin: 11.7 g/dL — ABNORMAL LOW (ref 12.0–15.0)
MCH: 27.8 pg (ref 26.0–34.0)
MCHC: 32.9 g/dL (ref 30.0–36.0)
MCV: 84.6 fL (ref 78.0–100.0)
PLATELETS: 168 10*3/uL (ref 150–400)
RBC: 4.21 MIL/uL (ref 3.87–5.11)
RDW: 13.9 % (ref 11.5–15.5)
WBC: 11.7 10*3/uL — AB (ref 4.0–10.5)

## 2014-09-12 LAB — CREATININE, URINE, 24 HOUR
CREATININE 24H UR: 532 mg/d — AB (ref 700–1800)
Collection Interval-UCRE24: 24 hours
Creatinine, Urine: 23.14 mg/dL
Urine Total Volume-UCRE24: 2300 mL

## 2014-09-12 LAB — GLUCOSE, CAPILLARY
Glucose-Capillary: 95 mg/dL (ref 70–99)
Glucose-Capillary: 89 mg/dL (ref 70–99)
Glucose-Capillary: 94 mg/dL (ref 70–99)
Glucose-Capillary: 100 mg/dL — ABNORMAL HIGH (ref 70–99)

## 2014-09-12 LAB — BASIC METABOLIC PANEL
Anion gap: 12 (ref 5–15)
BUN: 58 mg/dL — ABNORMAL HIGH (ref 6–23)
CHLORIDE: 92 mmol/L — AB (ref 96–112)
CO2: 27 mmol/L (ref 19–32)
Calcium: 8 mg/dL — ABNORMAL LOW (ref 8.4–10.5)
Creatinine, Ser: 2.77 mg/dL — ABNORMAL HIGH (ref 0.50–1.10)
GFR calc non Af Amer: 17 mL/min — ABNORMAL LOW (ref >90)
GFR, EST AFRICAN AMERICAN: 20 mL/min — AB (ref >90)
GLUCOSE: 104 mg/dL — AB (ref 70–99)
POTASSIUM: 4 mmol/L (ref 3.5–5.1)
SODIUM: 131 mmol/L — AB (ref 135–145)

## 2014-09-12 LAB — PROTEIN, URINE, 24 HOUR
Collection Interval-UPROT: 24 hours
Protein, 24H Urine: 414 mg/d — ABNORMAL HIGH (ref 50–100)
Protein, Urine: 18 mg/dL
Urine Total Volume-UPROT: 2300 mL

## 2014-09-12 LAB — CARBOXYHEMOGLOBIN
CARBOXYHEMOGLOBIN: 1 % (ref 0.5–1.5)
Methemoglobin: 0.7 % (ref 0.0–1.5)
O2 SAT: 94.7 %
Total hemoglobin: 12.3 g/dL (ref 12.0–16.0)

## 2014-09-12 LAB — PROTIME-INR
INR: 1.18 (ref 0.00–1.49)
Prothrombin Time: 15.1 seconds (ref 11.6–15.2)

## 2014-09-12 LAB — HEPARIN LEVEL (UNFRACTIONATED): Heparin Unfractionated: 0.63 IU/mL (ref 0.30–0.70)

## 2014-09-12 MED ORDER — WARFARIN SODIUM 7.5 MG PO TABS
7.5000 mg | ORAL_TABLET | Freq: Once | ORAL | Status: AC
  Administered 2014-09-12: 7.5 mg via ORAL
  Filled 2014-09-12: qty 1

## 2014-09-12 MED ORDER — NEPRO/CARBSTEADY PO LIQD
237.0000 mL | ORAL | Status: DC
  Filled 2014-09-12 (×3): qty 237

## 2014-09-12 NOTE — Progress Notes (Signed)
Subjective:    The patient is more alert and oriented this morning. She says that she has not noticed any improvement in her edema, but her daughter has reported that it looks better. She continues to have some "spasms"in her left leg. She denies shortness of breath or chest pain currently.  Interval Events: -Blood pressures significantly improved today.  -Co-ox up to 94.7%  -Weight down 3 pounds after diuresing 1.4 L yesterday.    Objective:    Vital Signs:   Temp:  [97.4 F (36.3 C)-98.1 F (36.7 C)] 97.7 F (36.5 C) (04/12 0725) Pulse Rate:  [44-141] 44 (04/12 0800) Resp:  [12-24] 14 (04/12 0800) BP: (82-141)/(41-75) 118/57 mmHg (04/12 0800) SpO2:  [87 %-99 %] 95 % (04/12 0800) Weight:  [222 lb 6.4 oz (100.88 kg)] 222 lb 6.4 oz (100.88 kg) (04/12 0400) Last BM Date: 09/11/14  24-hour weight change: Weight change: -2 lb 9.6 oz (-1.179 kg)  Intake/Output:   Intake/Output Summary (Last 24 hours) at 09/12/14 0841 Last data filed at 09/12/14 0800  Gross per 24 hour  Intake 941.58 ml  Output   2326 ml  Net -1384.42 ml      Physical Exam: General: Well-developed, well-nourished, in no acute distress. Alert and oriented. Blind.  Lungs:  Breathing comfortably, minimal crackles.   Heart: Regular rate, JVD. No murmurs, rubs, or gallops.  Abdomen:  BS normoactive. Soft, non-tender.  Extremities: 3+ pitting edema extending to the abdomen, slightly improved. Open wounds and blisters on bilateral lower extremities very tender to palpation. Lower extremities cool.      Labs:  Basic Metabolic Panel:  Recent Labs Lab 09/07/14 1910 09/08/14 0648 09/08/14 1540 09/09/14 0509 09/10/14 0410 09/11/14 0437 09/12/14 0510  NA  --  129*  --  129* 131* 131* 131*  K  --  4.5  --  4.6 4.6 4.1 4.0  CL  --  97  --  95* 95* 94* 92*  CO2  --  23  --  GLUCOSE  --  128*  --  184* 140* 95 104*  BUN  --  51*  --  56* 56* 56* 58*  CREATININE  --  2.52*  --  2.67* 2.59*  2.66* 2.77*  CALCIUM  --  7.8*  --  7.9* 7.9* 8.0* 8.0*  MG 2.1  --   --   --   --   --   --   PHOS  --   --  4.7*  --   --   --   --     Liver Function Tests:  Recent Labs Lab 09/05/2014 1942  AST 33  ALT 28  ALKPHOS 139*  BILITOT 1.3*  PROT 5.3*  ALBUMIN 2.5*    CBC:  Recent Labs Lab 09/22/2014 1942 09/07/14 0307 09/09/14 0509 09/10/14 0410 09/11/14 0437 09/12/14 0510  WBC 20.6* 16.5* 14.9* 12.3* 10.8* 11.7*  NEUTROABS 17.3* 14.5*  --   --   --   --   HGB 15.9* 13.6 12.7 12.5 11.8* 11.7*  HCT 47.0* 40.8 38.5 37.8 35.6* 35.6*  MCV 84.8 84.8 85.2 84.6 85.0 84.6  PLT 249 234 197 178 171 168    Cardiac Enzymes:  Recent Labs Lab 09/07/14 0320 09/07/14 0658 09/08/14 0500  TROPONINI 5.18* 5.76* 3.64*    CBG:  Recent Labs Lab 09/11/14 1207 09/11/14 1232 09/11/14 1659 09/11/14 2107 09/12/14 0728  GLUCAP 67* 77 102* 211* 95   ABG  Component Value Date/Time   PHART 7.378 09/11/2014 1030   PCO2ART 38.2 09/11/2014 1030   PO2ART 65.0* 09/11/2014 1030   HCO3 22.6 09/11/2014 1030   TCO2 24 09/11/2014 1030   ACIDBASEDEF 2.0 09/11/2014 1030   O2SAT 94.7 09/12/2014 0550   Imaging: No results found.     Medications:    Infusions: . heparin 1,350 Units/hr (09/12/14 0800)  . milrinone 0.25 mcg/kg/min (09/12/14 0800)  . norepinephrine (LEVOPHED) Adult infusion Stopped (09/09/14 0500)    Scheduled Medications: . amoxicillin-clavulanate  1 tablet Oral BID  . antiseptic oral rinse  7 mL Mouth Rinse BID  . atorvastatin  80 mg Oral q1800  . clopidogrel  75 mg Oral Daily  . feeding supplement (GLUCERNA SHAKE)  237 mL Oral BID BM  . feeding supplement (PRO-STAT SUGAR FREE 64)  30 mL Oral BID WC  . furosemide  160 mg Intravenous Q6H  . insulin aspart  0-20 Units Subcutaneous TID WC  . insulin aspart  0-5 Units Subcutaneous QHS  . insulin detemir  5 Units Subcutaneous QHS  . ipratropium-albuterol  3 mL Nebulization BID  . metolazone  5 mg Oral BID  .  multivitamin with minerals  1 tablet Oral Daily  . sodium chloride  3 mL Intravenous Q12H  . Warfarin - Pharmacist Dosing Inpatient   Does not apply q1800    PRN Medications: acetaminophen, albuterol, dextromethorphan-guaiFENesin, fentaNYL, oxymetazoline   Assessment/ Plan:    Principal Problem:   Acute combined systolic and diastolic heart failure Active Problems:   AKI (acute kidney injury)   Elevated troponin   Malnutrition   Elevated alkaline phosphatase level   Lactic acid acidosis   Hyperglycemia   Skin breakdown   NSTEMI (non-ST elevated myocardial infarction)  #Acute combined congestive heart failure Much better diuresis yesterday after adding metolazone. Fluid restriction appears to be working. She she needs ongoing diuresis, which will likely take some time. -Continue to follow heart failure recommendations.  -Continue Lasix 160 mg IV every 6 hours. -1.2 L fluid restriction. -Strict ins and outs. -Fentanyl 25-50 g IV every 4 hours as needed. -Continue milrinone 0.25 mcg/kg/m. -Continue metolazone 5 mg twice a day. -Dress wounds per wound care recommendations. -Will have wound care come back for additional recommendations.  #Altered mental status Resolved today. Possibly due to awaking from sleep yesterday. -Continue to hold Vicodin.  #Acute versus chronic kidney disease Creatinine slightly increased this morning due to diuresis, continued improved urine output. Renal considering dialysis given slow progress. -Appreciate nephrology recommendations. -Continue diuresis as above and monitor creatinine. -Follow-up 24 hour urine protein per nephrology.  #NSTEMI Managing medically. -Continue heparin drip. -Continue atorvastatin 80 mg daily. -Continue Clopidogrel 75 mg daily. -Heart diet.  #Superficial femoral vein DVT with possible pulmonary embolism Plan to transition to Coumadin for indefinite anticoagulation. INR 1.18 this morning. -Continue heparin drip  as above. -Continue coumadin per pharmacy.  #Community acquired pneumonia Switching to oral antibiotics today. -Switch from Zosyn IV to Augmentin, day 5 looking back at chart. Continue for total of 7 days. -Continue Mucinex DM when necessary. -DuoNeb's twice a day. -Albuterol nebs every 4 hours when necessary.  #Peripheral vascular disease No signs of ischemia currently. -Outpatient follow-up in 6-8 weeks.  #Elevated alkaline phosphatase and total bilirubin -Consider right upper quadrant ultrasound in future.  #Type 2 diabetes Blood sugars improved this morning. -Continue capillary blood glucose before meals at bedtime with sliding scale insulin resistant. -Continue Levemir to 5 units daily at bedtime.  #Blindness -Outpatient  workup.  #Malnutrition and failure to thrive PT and OT recommending skilled nursing facility along with wheelchair and rolling walker. -Multivitamin daily. -Glucerna shake twice a day and pro-stat twice a day per nutrition. -Consult social work for skilled nursing facility placement.   DVT PPX - heparin  CODE STATUS - DNR/DNI  CONSULTS PLACED - Cardiology, Heart Failure, Vascular Surgery, Nephrology.  DISPO - Disposition is deferred at this time, awaiting improvement of multiple medical problems.   The patient does not have a current PCP (Pcp Not In System) and does need an [facility] hospital follow-up appointment after discharge.    Is the [facility] hospital follow-up appointment a one-time only appointment? yes.  Does the patient have transportation limitations that hinder transportation to clinic appointments? yes   SERVICE NEEDED AT DISCHARGE - TO BE DETERMINED DURING HOSPITAL COURSE         Y = Yes, Blank = No PT: SNF  OT: SNF  RN:   Equipment: Wheelchair with cushion, rolling walker  Other:      Length of Stay: 6 day(s)   Signed: Donavan Foil, MD  PGY-1, Internal Medicine Resident Pager: [phone] (7AM-5PM) 09/12/2014, 8:41 AM

## 2014-09-12 NOTE — Progress Notes (Signed)
Assessment: 1. 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 2. Hypotension sec cardiomyopathy. Now off pressors 3. PVD 4. DM 5. 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 DVT  Plan: 1. Cont diuretics 2. F/U 24 hour urine protein 3. I think we will need to do dialysis, discussed with pt, if no better Wed  Subjective: Interval History: fair UOP  Objective: Vital signs in last 24 hours: Temp:  [97.4 F (36.3 C)-98.1 F (36.7 C)] 97.7 F (36.5 C) (04/12 0725) Pulse Rate:  [44-141] 44 (04/12 0800) Resp:  [12-24] 14 (04/12 0800) BP: (82-141)/(41-75) 118/57 mmHg (04/12 0800) SpO2:  [87 %-99 %] 95 % (04/12 0800) Weight:  [100.88 kg (222 lb 6.4 oz)] 100.88 kg (222 lb 6.4 oz) (04/12 0400) Weight change: -1.179 kg (-2 lb 9.6 oz)  Intake/Output from previous day: 04/11 0701 - 04/12 0700 In: 920.5 [I.V.:524.5; IV Piggyback:396] Out: 2326 [Urine:2325; Stool:1] Intake/Output this shift: Total I/O In: 21.1 [I.V.:21.1] Out: -   General appearance: alert and cooperative Resp: diminished breath sounds bilaterally Extremities: edema 2+  Lab Results:  Recent Labs  09/11/14 0437 09/12/14 0510  WBC 10.8* 11.7*  HGB 11.8* 11.7*  HCT 35.6* 35.6*  PLT 171 168   BMET:  Recent Labs  09/11/14 0437 09/12/14 0510  NA 131* 131*  K 4.1 4.0  CL 94* 92*  CO2 27 27  GLUCOSE 95 104*  BUN 56* 58*  CREATININE 2.66* 2.77*  CALCIUM 8.0* 8.0*   No results for input(s): PTH in the last 72 hours. Iron Studies: No results for input(s): IRON, TIBC, TRANSFERRIN, FERRITIN in the last 72 hours. Studies/Results: No results found.  Scheduled: . amoxicillin-clavulanate  1 tablet Oral BID  . antiseptic oral rinse  7 mL Mouth Rinse BID  . atorvastatin  80 mg Oral q1800  . clopidogrel  75 mg Oral Daily  . feeding supplement (GLUCERNA SHAKE)  237 mL Oral BID BM  . feeding supplement (PRO-STAT SUGAR FREE 64)  30 mL Oral BID WC  . furosemide  160 mg Intravenous Q6H  . insulin  aspart  0-20 Units Subcutaneous TID WC  . insulin aspart  0-5 Units Subcutaneous QHS  . insulin detemir  5 Units Subcutaneous QHS  . ipratropium-albuterol  3 mL Nebulization BID  . metolazone  5 mg Oral BID  . multivitamin with minerals  1 tablet Oral Daily  . sodium chloride  3 mL Intravenous Q12H  . Warfarin - Pharmacist Dosing Inpatient   Does not apply q1800       LOS: 6 days   Yeimi Debnam C 09/12/2014,9:13 AM

## 2014-09-12 NOTE — Progress Notes (Signed)
Physical Therapy Treatment Patient Details Name: Tina Atkins MRN: [id number] DOB: Dec 20, 1950 Today's Date: 09/12/2014    History of Present Illness 64 y/o female with PMH of HTN, prediabetes, total blindness who p/w progressive SOB, cough and LE swelling over the past week. Patient states that she initially developed a cough which was non productive but then noted SOB/DOE which was progressive and she was unable to walk a couple of steps before getting SOB along with progressive LE edema with blisters and weeping. Pt with RLE DVT, PE, NSTEMI    PT Comments    Pt with improved mobility in bed and exercise tolerance. Pt continues to state extreme pain in LLE limiting desire or ability for EOB or OOB activity. Pt very pleasant and encouraged to continue to perform HEP in bed as well as to sit fully upright and to eat. Will continue to follow.   Follow Up Recommendations  SNF;Supervision/Assistance - 24 hour     Equipment Recommendations       Recommendations for Other Services       Precautions / Restrictions Precautions Precautions: Fall Precaution Comments: totally blind, weeping legs    Mobility  Bed Mobility Overal bed mobility: Needs Assistance Bed Mobility: Supine to Sit     Supine to sit: Min assist     General bed mobility comments: Pt able to use bil hand rails to pull from HOB 30 degrees to almost full long sitting x 3 with sitting grossly 30 sec each trial. Significant need for bil UE support to maintain upright position. Pt with encouragement for OOB or EOB but denied attempting at this point. Pt denied rolling at this time  Transfers                    Ambulation/Gait                 Stairs            Wheelchair Mobility    Modified Rankin (Stroke Patients Only)       Balance                                    Cognition Arousal/Alertness: Awake/alert Behavior During Therapy: WFL for tasks  assessed/performed Overall Cognitive Status: Within Functional Limits for tasks assessed                      Exercises General Exercises - Upper Extremity Shoulder Flexion: AROM;Both;15 reps;Supine Elbow Flexion: AROM;Both;15 reps;Supine Elbow Extension: AROM;Both;15 reps;Supine General Exercises - Lower Extremity Short Arc Quad: AROM;Right;15 reps;Supine Heel Slides: AROM;Right;15 reps;Supine    General Comments        Pertinent Vitals/Pain Pain Score: 6  Pain Location: LLE with moving Pain Intervention(s): Limited activity within patient's tolerance;Repositioned;Patient requesting pain meds-RN notified         HR 102 sats 95% on 1.5 L  Home Living                      Prior Function            PT Goals (current goals can now be found in the care plan section) Progress towards PT goals: Progressing toward goals    Frequency       PT Plan Current plan remains appropriate    Co-evaluation             End  of Session Equipment Utilized During Treatment: Oxygen Activity Tolerance: Patient limited by pain Patient left: in bed;with call bell/phone within reach     Time: 0851-0915 PT Time Calculation (min) (ACUTE ONLY): 24 min  Charges:  $Therapeutic Exercise: 23-37 mins                    G Codes:      Delorse Lek 09/22/14, 9:25 AM Delaney Meigs, PT [phone]

## 2014-09-12 NOTE — Progress Notes (Signed)
Pharmacist Heart Failure Core Measure Documentation  Assessment: Neria Lowry [place] has an EF documented as 20-25% on 09/07/14 by ECHO.  Rationale: Heart failure patients with left ventricular systolic dysfunction (LVSD) and an EF < 40% should be prescribed an angiotensin converting enzyme inhibitor (ACEI) or angiotensin receptor blocker (ARB) at discharge unless a contraindication is documented in the medical record.  This patient is not currently on an ACEI or ARB for HF.  This note is being placed in the record in order to provide documentation that a contraindication to the use of these agents is present for this encounter.  ACE Inhibitor or Angiotensin Receptor Blocker is contraindicated (specify all that apply)  []   ACEI allergy AND ARB allergy []   Angioedema []   Moderate or severe aortic stenosis []   Hyperkalemia []   Hypotension []   Renal artery stenosis [x]   Worsening renal function, preexisting renal disease or dysfunction   Tad MooreJessica Suresh Audi, Pharm D, BCPS  Clinical Pharmacist Pager [phone]  09/12/2014 11:26 AM

## 2014-09-12 NOTE — Progress Notes (Signed)
Patient ID: Tina Atkins, female   DOB: Apr 07, 1951, 64 y.o.   MRN: [id number]   SUBJECTIVE:   On milrinone at 0.25. Co-ox up to 94%. Yesterday metolazone was added. Brisk diuresis over night. Weight down 2 pounds.   Denies dyspnea. Complaining of L foot pain and sore throat.    Creatinine 2.77   Scheduled Meds: . amoxicillin-clavulanate  1 tablet Oral BID  . antiseptic oral rinse  7 mL Mouth Rinse BID  . atorvastatin  80 mg Oral q1800  . clopidogrel  75 mg Oral Daily  . feeding supplement (GLUCERNA SHAKE)  237 mL Oral BID BM  . feeding supplement (PRO-STAT SUGAR FREE 64)  30 mL Oral BID WC  . furosemide  160 mg Intravenous Q6H  . insulin aspart  0-20 Units Subcutaneous TID WC  . insulin aspart  0-5 Units Subcutaneous QHS  . insulin detemir  5 Units Subcutaneous QHS  . ipratropium-albuterol  3 mL Nebulization BID  . metolazone  5 mg Oral BID  . multivitamin with minerals  1 tablet Oral Daily  . sodium chloride  3 mL Intravenous Q12H  . Warfarin - Pharmacist Dosing Inpatient   Does not apply q1800   Continuous Infusions: . heparin 1,350 Units/hr (09/12/14 0700)  . milrinone 0.25 mcg/kg/min (09/12/14 0700)  . norepinephrine (LEVOPHED) Adult infusion Stopped (09/09/14 0500)   PRN Meds:.acetaminophen, albuterol, dextromethorphan-guaiFENesin, fentaNYL, oxymetazoline  Filed Vitals:   09/12/14 0400 09/12/14 0500 09/12/14 0600 09/12/14 0700  BP: 119/69  119/69   Pulse: 96 92 92 85  Temp: 97.4 F (36.3 C)     TempSrc: Oral     Resp: Height:      Weight: 222 lb 6.4 oz (100.88 kg)     SpO2: 94% 93% 95% 94%    Intake/Output Summary (Last 24 hours) at 09/12/14 0710 Last data filed at 09/12/14 0700  Gross per 24 hour  Intake 920.48 ml  Output   2326 ml  Net -1405.52 ml    LABS: Basic Metabolic Panel:  Recent Labs  40/98/11 0437 09/12/14 0510  NA 131* 131*  K 4.1 4.0  CL 94* 92*  CO2 27 27  GLUCOSE 95 104*  BUN 56* 58*  CREATININE 2.66* 2.77*    CALCIUM 8.0* 8.0*   Liver Function Tests: No results for input(s): AST, ALT, ALKPHOS, BILITOT, PROT, ALBUMIN in the last 72 hours. No results for input(s): LIPASE, AMYLASE in the last 72 hours. CBC:  Recent Labs  09/11/14 0437 09/12/14 0510  WBC 10.8* 11.7*  HGB 11.8* 11.7*  HCT 35.6* 35.6*  MCV 85.0 84.6  PLT 171 168   Cardiac Enzymes: No results for input(s): CKTOTAL, CKMB, CKMBINDEX, TROPONINI in the last 72 hours. BNP: Invalid input(s): POCBNP D-Dimer: No results for input(s): DDIMER in the last 72 hours. Hemoglobin A1C: No results for input(s): HGBA1C in the last 72 hours. Fasting Lipid Panel: No results for input(s): CHOL, HDL, LDLCALC, TRIG, CHOLHDL, LDLDIRECT in the last 72 hours. Thyroid Function Tests: No results for input(s): TSH, T4TOTAL, T3FREE, THYROIDAB in the last 72 hours.  Invalid input(s): FREET3 Anemia Panel: No results for input(s): VITAMINB12, FOLATE, FERRITIN, TIBC, IRON, RETICCTPCT in the last 72 hours.  RADIOLOGY: Dg Chest 2 View  09/07/2014   CLINICAL DATA:  Difficulty breathing  EXAM: CHEST  2 VIEW  COMPARISON:  September 06, 2014  FINDINGS: There is a persistent left pleural effusion with left base atelectasis. There is a new small right effusion.  Lungs are otherwise clear. Heart is mildly enlarged with pulmonary vascularity within normal limits. No adenopathy. There is degenerative change in the thoracic spine.  IMPRESSION: Persistent left effusion with left base atelectasis. New small right effusion. No change in cardiac silhouette.   Electronically Signed   By: Bretta Bang III M.D.   On: 09/07/2014 09:08   US Renal  09/08/2014   CLINICAL DATA:  Renal failure.  Hypertension.  Diabetes.  EXAM: RENAL/URINARY TRACT ULTRASOUND COMPLETE  COMPARISON:  None.  FINDINGS: Right Kidney:  Length: 10.4 cm. Echogenicity within normal limits. No mass or hydronephrosis visualized. 6 mm nonobstructing calyceal stone noted .  Left Kidney:  Length: 10.7 cm.  Echogenicity within normal limits. No mass or hydronephrosis visualized.  Bladder:  Appears normal for degree of bladder distention.  IMPRESSION: 1. 6 mm nonobstructing calyceal stone right kidney. 2. Otherwise negative exam.   Electronically Signed   By: Maisie Fus  Register   On: 09/08/2014 07:08   Dg Chest Port 1 View  09/08/2014   CLINICAL DATA:  Central line placement  EXAM: PORTABLE CHEST - 1 VIEW  COMPARISON:  09/07/2014  FINDINGS: Interval placement of a left central venous catheter with tip over the cavoatrial junction. No pneumothorax. Cardiac enlargement. Increasing small bilateral pleural effusions and basilar atelectatic changes. No pneumothorax. Mediastinal contours appear intact.  IMPRESSION: Appliances appear in satisfactory position. Cardiac enlargement with increasing pleural effusions and basilar atelectatic changes since previous study.   Electronically Signed   By: Burman Nieves M.D.   On: 09/08/2014 05:44   Dg Chest Portable 1 View  04-Oct-2014   CLINICAL DATA:  Shortness of breath. Dry cough for 1 week. Congestion.  EXAM: PORTABLE CHEST - 1 VIEW  COMPARISON:  None.  FINDINGS: Indistinct obscuration left hemidiaphragm, probably from a left pleural effusion with passive atelectasis, although left lower lobe pneumonia is not excluded. The right lung appears clear. Mild enlargement of the cardiopericardial silhouette. Thoracic spondylosis.  IMPRESSION: 1. Hazy obscuration left hemidiaphragm -differential diagnostic considerations include left lower lobe atelectasis, left lower lobe pneumonia, or a layering pleural effusion. 2. Mild enlargement of the cardiopericardial silhouette.   Electronically Signed   By: Gaylyn Rong M.D.   On: 2014-10-04 20:42    PHYSICAL EXAM- CVP 18  General: Chronically-ill appearing. In bed.  HEENT: normal Neck: supple. JVP to jaw.  Cor: PMI nondisplaced. Regular rate & rhythm. No rubs, gallops or murmurs. Lungs: Crackles dependently. Abdomen: soft,  nontender, nondistended. No hepatosplenomegaly. No bruits or masses. Good bowel sounds. Extremities: no cyanosis, clubbing, rash, R and LLE 3+ edema, LLE cooler. Partial thickness skin loss on posterior aspect.  Neuro: alert & orientedx3, blind. moves all 4 extremities w/o difficulty. Affect pleasant  TELEMETRY: Reviewed telemetry NSR 90s  ASSESSMENT AND PLAN: 1. Acute systolic CHF: EF 16-10%, suspect ischemic cardiomyopathy.  Getting high dose Lasix boluses per nephrology, now on milrinone 0.25 with SBP ok. CO-OX 94% will repeat. Brisk diuresis. Continue lasix 160 mg IV every 6 hours + metolazone 5 mg twice a day.  2. CAD: NSTEMI, troponin up to 5.86.  Not interventional candidate at this time with AKI.  No chest pain. Continue atorvastatin, not on ASA due to h/o anaphylaxis.  She is on heparin gtt and Plavix.  On coumadin. INR 1.18 pharmacy dosing coumadin.  3.NSVT: 09/07/2014.  No beta blocker due to shock.  4. DVT: RLE DVT on Heparin. Loading coumadin.  INR 1.18 5.Diabetes mellitus type II uncontrolled 6.Renal failure, suspect AKI on CKD.  Creatinine relatively stable. 7.Blind. 8.Morbid obesity: BMI 40.8. 9. PAD: Seen by vascular, suspect multilevel arterial occlusive disease, not interventional candidate at this time. Vascular to followup as outpatient.  10. ID: Completes 4 days of Zosyn for possible PNA. Now on augmentin day # 1/3  11. DNR  CLEGG,AMY NP-C  7:10 AM  Patient seen and examined with Tonye BecketAmy Clegg, NP. We discussed all aspects of the encounter. I agree with the assessment and plan as stated above.   Urine output improved with metolazone but still less than I would expect. Creatinine slightly up. She remains massively volume overloaded. CVP 18-20. Will continue current regimen. Will get Renal's input on trial of low-dose dopamine. Continue Plavix for NSTEMI. No ASA due to alelrgy.   Daniel Bensimhon,MD 8:40 AM

## 2014-09-12 NOTE — Progress Notes (Signed)
NUTRITION FOLLOW UP  Intervention:   -D/c Glucerna Shake po BID, each supplement provides 220 kcal and 10 grams of protein -Nepro Shake po daily, each supplement provides 425 kcal and 19 grams protein -Continue 30 ml Prostat BID, each supplement provides 100 kcals and 15 grams protein  Nutrition Dx:   Inadequate oral intake related to acute illness and pain as evidenced by <25% meal completion; ongoing  Goal:   Pt to meet >/= 90% of their estimated nutrition needs; unmet  Monitor:   PO intake, weight trend, labs  Assessment:   64 y/o female with PMH of HTN, prediabetes, total blindness who p/w progressive SOB, cough and LE swelling over the past week.  Pt was very lethargic at visit. Noted pt is very edematous. Chart review reveals that pt with poor prognosis. She continues with edema and has gained weight despite high dose diuretics. Pt has been put on a 1.2 L fluid restriction, however, RD noted that pt was previously non-complaint with fluid restriction due to consuming water as a result of feeling thirsty. Noted two unopened water bottles at bedside table. Per MD notes, pt may initiate HD on 07/15/14 if pt status does not improve.  Intake has improved slightly. Noted PO: 25-50%. MAR reveals that pt is accepting Glucerna and Prostat supplements.  Labs reviewed. Na: 131, Cl: 92, 58/2.77, Calcium: 8.0, Phos: 4.7 CBGS: 95-211.   Height: Ht Readings from Last 1 Encounters:  09/08/2014  (1.549 m)    Weight Status:   Wt Readings from Last 1 Encounters:  09/12/14 222 lb 6.4 oz (100.88 kg)   09-08-14 216 lb (97.977 kg)       Re-estimated needs:  Kcal: 1700-1900 Protein: 70-80 grams Fluid: >1.5 L  Skin: large blister areas on lt leg, open blisters on rt anterior leg, +2 generalized edema, +4 RLE and LLE edema  Diet Order: Diet Heart Room service appropriate?: Yes; Fluid consistency:: Thin; Fluid restriction:: 1200 mL Fluid   Intake/Output Summary (Last 24 hours) at  09/12/14 1052 Last data filed at 09/12/14 0800  Gross per 24 hour  Intake 941.58 ml  Output   2126 ml  Net -1184.42 ml    Last BM: 09/11/14   Labs:   Recent Labs Lab 09/07/14 1910  09/08/14 1540  09/10/14 0410 09/11/14 0437 09/12/14 0510  NA  --   < >  --   < > 131* 131* 131*  K  --   < >  --   < > 4.6 4.1 4.0  CL  --   < >  --   < > 95* 94* 92*  CO2  --   < >  --   < > BUN  --   < >  --   < > 56* 56* 58*  CREATININE  --   < >  --   < > 2.59* 2.66* 2.77*  CALCIUM  --   < >  --   < > 7.9* 8.0* 8.0*  MG 2.1  --   --   --   --   --   --   PHOS  --   --  4.7*  --   --   --   --   GLUCOSE  --   < >  --   < > 140* 95 104*  < > = values in this interval not displayed.  CBG (last 3)   Recent Labs  09/11/14 1659 09/11/14 2107  09/12/14 0728  GLUCAP 102* 211* 95    Scheduled Meds: . amoxicillin-clavulanate  1 tablet Oral BID  . antiseptic oral rinse  7 mL Mouth Rinse BID  . atorvastatin  80 mg Oral q1800  . clopidogrel  75 mg Oral Daily  . feeding supplement (GLUCERNA SHAKE)  237 mL Oral BID BM  . feeding supplement (PRO-STAT SUGAR FREE 64)  30 mL Oral BID WC  . furosemide  160 mg Intravenous Q6H  . insulin aspart  0-20 Units Subcutaneous TID WC  . insulin aspart  0-5 Units Subcutaneous QHS  . insulin detemir  5 Units Subcutaneous QHS  . ipratropium-albuterol  3 mL Nebulization BID  . metolazone  5 mg Oral BID  . multivitamin with minerals  1 tablet Oral Daily  . sodium chloride  3 mL Intravenous Q12H  . warfarin  7.5 mg Oral ONCE-1800  . Warfarin - Pharmacist Dosing Inpatient   Does not apply q1800    Continuous Infusions: . heparin 1,350 Units/hr (09/12/14 0800)  . milrinone 0.25 mcg/kg/min (09/12/14 0800)  . norepinephrine (LEVOPHED) Adult infusion Stopped (09/09/14 0500)    Evaluna Utke A. Mayford KnifeWilliams, RD, LDN, CDE Pager: [phone] After hours Pager: [phone]

## 2014-09-12 NOTE — Progress Notes (Signed)
Occupational Therapy Treatment Patient Details Name: Tina Atkins MRN: [id number] DOB: May 31, 1951 Today's Date: 09/12/2014    History of present illness 64 y/o female with PMH of HTN, prediabetes, total blindness who p/w progressive SOB, cough and LE swelling over the past week. Patient states that she initially developed a cough which was non productive but then noted SOB/DOE which was progressive and she was unable to walk a couple of steps before getting SOB along with progressive LE edema with blisters and weeping. Pt with RLE DVT, PE, NSTEMI   OT comments  Pt performed bil. UE exercise, but noted to fatigue half way through and fell asleep multiple times.   Will continue to follow.   Follow Up Recommendations  SNF    Equipment Recommendations  None recommended by OT    Recommendations for Other Services      Precautions / Restrictions Precautions Precautions: Fall Precaution Comments: totally blind, weeping legs       Mobility Bed Mobility                  Transfers                 General transfer comment: Pt refused OOB or EOB due to pain and fatigue     Balance                                   ADL                                                Vision                     Perception     Praxis      Cognition   Behavior During Therapy: Flat affect Overall Cognitive Status: Difficult to assess                       Extremity/Trunk Assessment               Exercises Other Exercises Other Exercises: Pt performed 10 reps minimally resisted Rt shoulder flex/ext, abd/add, elbow flex/ext and 8-10 reps AAROM Rt shoulder horizontal abduction/add, Lt shoulder flex/ext, abd/add.  Pt initially awake and participatory, but progressively became more lethargic falling asleep x 4.     Shoulder Instructions       General Comments      Pertinent Vitals/ Pain       Pain Assessment:  Faces Faces Pain Scale: Hurts whole lot Pain Location: LEs Pain Descriptors / Indicators: Constant;Aching Pain Intervention(s): Limited activity within patient's tolerance  Home Living                                          Prior Functioning/Environment              Frequency Min 2X/week     Progress Toward Goals  OT Goals(current goals can now be found in the care plan section)     ADL Goals Pt Will Transfer to Toilet: with mod assist;with +2 assist;squat pivot transfer;stand pivot transfer;bedside commode Pt/caregiver will Perform Home Exercise Program: Increased strength;Both right and left upper  extremity;With theraband;With Supervision Additional ADL Goal #1: Pt will be Mod A for bed mobility  Plan Discharge plan remains appropriate    Co-evaluation                 End of Session     Activity Tolerance Patient limited by lethargy   Patient Left in bed;with call bell/phone within reach   Nurse Communication Mobility status;Patient requests pain meds        Time: 1446-1455 OT Time Calculation (min): 9 min  Charges: OT General Charges $OT Visit: 1 Procedure OT Treatments $Therapeutic Exercise: 8-22 mins  Brinson Tozzi M 09/12/2014, 3:22 PM

## 2014-09-12 NOTE — Telephone Encounter (Signed)
-----   Message from Phillips Odorarol S Pullins, RN sent at 09/11/2014  4:06 PM EDT ----- Regarding: Joyce GrossKay log; also needs 6-8 wk  ABI's and f/u with [facility]    ----- Message -----    From: Chuck Hinthristopher S Dickson, MD    Sent: 09/10/2014   8:02 AM      To: [facility] Charge Pool Subject: charge and f/u                                 Level 1 f/u visit  She will need a follow up visit in 6-8 weeks with ABIs. Thank you. CD

## 2014-09-12 NOTE — Telephone Encounter (Signed)
Patients daughter wants to wait to schedule this when her mother has been discharged, dpm

## 2014-09-12 NOTE — Progress Notes (Signed)
ANTICOAGULATION CONSULT NOTE - Follow Up Consult  Pharmacy Consult for Heparin and Coumadin Indication: DVT  Allergies  Allergen Reactions  . Bufferin Low Dose [Aspirin] Anaphylaxis    Swelling in face and cough, laryngospasm  . Iodine Nausea And Vomiting    Crab meat  . Neosporin Original [Bacitracin-Neomycin-Polymyxin] Rash    Bacitracin- makes her skin red and rashy  . Sulfites Other (See Comments)    Turns her face purple and feels hot    Patient Measurements: Height: 5\' 1"  (154.9 cm) Weight: 222 lb 6.4 oz (100.88 kg) IBW/kg (Calculated) : 47.8 Heparin Dosing Weight: 72kg  Vital Signs: Temp: 97.7 F (36.5 C) (04/12 0725) Temp Source: Oral (04/12 0725) BP: 118/57 mmHg (04/12 0800) Pulse Rate: 102 (04/12 0918)  Labs:  Recent Labs  09/10/14 0410 09/11/14 0437 09/11/14 1330 09/12/14 0510  HGB 12.5 11.8*  --  11.7*  HCT 37.8 35.6*  --  35.6*  PLT 178 171  --  168  LABPROT  --   --   --  15.1  INR  --   --   --  1.18  HEPARINUNFRC 0.36 0.77* 0.67 0.63  CREATININE 2.59* 2.66*  --  2.77*    Estimated Creatinine Clearance: 22.3 mL/min (by C-G formula based on Cr of 2.77).   Medications:  Heparin @ 1350 units/hr  Assessment: [age] yof with RLE DVT (per dopplers on 4/7) and NSTEMI (not a cath candidate) continues on IV heparin and coumadin day #2 overlap. Heparin level is therapeutic. INR remains below goal after first dose of coumadin as expected. Antibiotics have been changed to augmentin which may have a mild effect on her INR. CBC stable. No bleeding reported.  Goal of Therapy:  INR 2-3  Heparin level 0.3-0.7 Monitor platelets by anticoagulation protocol: Yes   Plan:  1) Continue heparin at 1350 units/hr 2) Repeat coumadin 7.5mg  x 1 3) Heparin level, INR, CBC in AM  Fredrik RiggerMarkle, Jorgeluis Gurganus Sue 09/12/2014,10:32 AM

## 2014-09-13 DIAGNOSIS — R570 Cardiogenic shock: Secondary | ICD-10-CM | POA: Insufficient documentation

## 2014-09-13 DIAGNOSIS — R4182 Altered mental status, unspecified: Secondary | ICD-10-CM

## 2014-09-13 DIAGNOSIS — R739 Hyperglycemia, unspecified: Secondary | ICD-10-CM

## 2014-09-13 DIAGNOSIS — J9601 Acute respiratory failure with hypoxia: Secondary | ICD-10-CM

## 2014-09-13 DIAGNOSIS — R740 Nonspecific elevation of levels of transaminase and lactic acid dehydrogenase [LDH]: Secondary | ICD-10-CM

## 2014-09-13 LAB — PROTIME-INR
INR: 1.84 — AB (ref 0.00–1.49)
Prothrombin Time: 21.4 seconds — ABNORMAL HIGH (ref 11.6–15.2)

## 2014-09-13 LAB — BASIC METABOLIC PANEL
Anion gap: 17 — ABNORMAL HIGH (ref 5–15)
BUN: 61 mg/dL — ABNORMAL HIGH (ref 6–23)
CO2: 21 mmol/L (ref 19–32)
CREATININE: 3.23 mg/dL — AB (ref 0.50–1.10)
Calcium: 8.1 mg/dL — ABNORMAL LOW (ref 8.4–10.5)
Chloride: 92 mmol/L — ABNORMAL LOW (ref 96–112)
GFR calc Af Amer: 16 mL/min — ABNORMAL LOW (ref >90)
GFR calc non Af Amer: 14 mL/min — ABNORMAL LOW (ref >90)
Glucose, Bld: 101 mg/dL — ABNORMAL HIGH (ref 70–99)
POTASSIUM: 4.7 mmol/L (ref 3.5–5.1)
SODIUM: 130 mmol/L — AB (ref 135–145)

## 2014-09-13 LAB — CBC
HEMATOCRIT: 38.2 % (ref 36.0–46.0)
HEMOGLOBIN: 12.5 g/dL (ref 12.0–15.0)
MCH: 27.4 pg (ref 26.0–34.0)
MCHC: 32.7 g/dL (ref 30.0–36.0)
MCV: 83.8 fL (ref 78.0–100.0)
Platelets: 180 10*3/uL (ref 150–400)
RBC: 4.56 MIL/uL (ref 3.87–5.11)
RDW: 14 % (ref 11.5–15.5)
WBC: 13.4 10*3/uL — AB (ref 4.0–10.5)

## 2014-09-13 LAB — CULTURE, BLOOD (ROUTINE X 2)
CULTURE: NO GROWTH
CULTURE: NO GROWTH

## 2014-09-13 LAB — CARBOXYHEMOGLOBIN
Carboxyhemoglobin: 0.7 % (ref 0.5–1.5)
Methemoglobin: 0.7 % (ref 0.0–1.5)
O2 SAT: 49.4 %
Total hemoglobin: 13 g/dL (ref 12.0–16.0)

## 2014-09-13 LAB — LACTIC ACID, PLASMA: Lactic Acid, Venous: 4.7 mmol/L (ref 0.5–2.0)

## 2014-09-13 LAB — GLUCOSE, CAPILLARY
GLUCOSE-CAPILLARY: 94 mg/dL (ref 70–99)
GLUCOSE-CAPILLARY: 99 mg/dL (ref 70–99)

## 2014-09-13 LAB — HEPARIN LEVEL (UNFRACTIONATED): Heparin Unfractionated: 1.09 IU/mL — ABNORMAL HIGH (ref 0.30–0.70)

## 2014-09-13 MED ORDER — MORPHINE BOLUS VIA INFUSION
5.0000 mg | INTRAVENOUS | Status: DC | PRN
  Filled 2014-09-13: qty 20

## 2014-09-13 MED ORDER — MORPHINE SULFATE 25 MG/ML IV SOLN
10.0000 mg/h | INTRAVENOUS | Status: DC
  Filled 2014-09-13: qty 10

## 2014-09-13 MED ORDER — PIPERACILLIN-TAZOBACTAM IN DEX 2-0.25 GM/50ML IV SOLN
2.2500 g | Freq: Three times a day (TID) | INTRAVENOUS | Status: DC
  Administered 2014-09-13: 2.25 g via INTRAVENOUS
  Filled 2014-09-13 (×3): qty 50

## 2014-09-13 MED ORDER — HEPARIN (PORCINE) IN NACL 100-0.45 UNIT/ML-% IJ SOLN
1100.0000 [IU]/h | INTRAMUSCULAR | Status: DC
  Filled 2014-09-13: qty 250

## 2014-09-13 MED ORDER — ONDANSETRON HCL 4 MG/2ML IJ SOLN
4.0000 mg | Freq: Four times a day (QID) | INTRAMUSCULAR | Status: DC
  Administered 2014-09-13: 4 mg via INTRAVENOUS
  Filled 2014-09-13: qty 2

## 2014-09-13 MED ORDER — OXYCODONE HCL 5 MG PO TABS: 5.0000 mg | ORAL_TABLET | Freq: Four times a day (QID) | ORAL | Status: DC | PRN

## 2014-09-26 NOTE — Telephone Encounter (Signed)
Patient is deceased, all appointments canceled, dpm

## 2014-10-01 NOTE — Progress Notes (Signed)
Patient ID: Tina Atkins, female   DOB: Sep 08, 1950, 64 y.o.   MRN: [id number]   SUBJECTIVE:   On milrinone at 0.25. She continues to diurese with IV lasix +metolazone. Weight down another 3 pounds.  Renal considering dialysis.   Dyspneic over night. Complaining of fatigue.     Creatinine 2.77>3.23 WBC 11.7>13.4   Scheduled Meds: . amoxicillin-clavulanate  1 tablet Oral BID  . antiseptic oral rinse  7 mL Mouth Rinse BID  . atorvastatin  80 mg Oral q1800  . clopidogrel  75 mg Oral Daily  . feeding supplement (NEPRO CARB STEADY)  237 mL Oral Q24H  . feeding supplement (PRO-STAT SUGAR FREE 64)  30 mL Oral BID WC  . furosemide  160 mg Intravenous Q6H  . insulin aspart  0-20 Units Subcutaneous TID WC  . insulin aspart  0-5 Units Subcutaneous QHS  . insulin detemir  5 Units Subcutaneous QHS  . ipratropium-albuterol  3 mL Nebulization BID  . metolazone  5 mg Oral BID  . multivitamin with minerals  1 tablet Oral Daily  . sodium chloride  3 mL Intravenous Q12H  . Warfarin - Pharmacist Dosing Inpatient   Does not apply q1800   Continuous Infusions: . heparin    . milrinone 0.25 mcg/kg/min (09/12/14 2000)  . norepinephrine (LEVOPHED) Adult infusion Stopped (09/09/14 0500)   PRN Meds:.acetaminophen, albuterol, dextromethorphan-guaiFENesin, fentaNYL, oxymetazoline  Filed Vitals:   09/01/2014 0000 09/16/2014 0417 09/29/2014 0420 09/18/2014 0500  BP: 98/66 81/40    Pulse: 97     Temp:   98 F (36.7 C)   TempSrc:   Oral   Resp:  20    Height:      Weight:    218 lb 9.6 oz (99.156 kg)  SpO2: 93%       Intake/Output Summary (Last 24 hours) at 09/08/2014 0713 Last data filed at 09/05/2014 0500  Gross per 24 hour  Intake  596.2 ml  Output   1570 ml  Net -973.8 ml    LABS: Basic Metabolic Panel:  Recent Labs  40/98/11 0510 09/19/2014 0500  NA 131* 130*  K 4.0 4.7  CL 92* 92*  CO2 27 21  GLUCOSE 104* 101*  BUN 58* 61*  CREATININE 2.77* 3.23*  CALCIUM 8.0* 8.1*   Liver Function  Tests: No results for input(s): AST, ALT, ALKPHOS, BILITOT, PROT, ALBUMIN in the last 72 hours. No results for input(s): LIPASE, AMYLASE in the last 72 hours. CBC:  Recent Labs  09/12/14 0510 09/28/2014 0500  WBC 11.7* 13.4*  HGB 11.7* 12.5  HCT 35.6* 38.2  MCV 84.6 83.8  PLT 168 180   Cardiac Enzymes: No results for input(s): CKTOTAL, CKMB, CKMBINDEX, TROPONINI in the last 72 hours. BNP: Invalid input(s): POCBNP D-Dimer: No results for input(s): DDIMER in the last 72 hours. Hemoglobin A1C: No results for input(s): HGBA1C in the last 72 hours. Fasting Lipid Panel: No results for input(s): CHOL, HDL, LDLCALC, TRIG, CHOLHDL, LDLDIRECT in the last 72 hours. Thyroid Function Tests: No results for input(s): TSH, T4TOTAL, T3FREE, THYROIDAB in the last 72 hours.  Invalid input(s): FREET3 Anemia Panel: No results for input(s): VITAMINB12, FOLATE, FERRITIN, TIBC, IRON, RETICCTPCT in the last 72 hours.  RADIOLOGY: Dg Chest 2 View  09/07/2014   CLINICAL DATA:  Difficulty breathing  EXAM: CHEST  2 VIEW  COMPARISON:  2014/10/03  FINDINGS: There is a persistent left pleural effusion with left base atelectasis. There is a new small right effusion. Lungs are  otherwise clear. Heart is mildly enlarged with pulmonary vascularity within normal limits. No adenopathy. There is degenerative change in the thoracic spine.  IMPRESSION: Persistent left effusion with left base atelectasis. New small right effusion. No change in cardiac silhouette.   Electronically Signed   By: Bretta BangWilliam  Woodruff III M.D.   On: 09/07/2014 09:08   [place] Renal  09/08/2014   CLINICAL DATA:  Renal failure.  Hypertension.  Diabetes.  EXAM: RENAL/URINARY TRACT ULTRASOUND COMPLETE  COMPARISON:  None.  FINDINGS: Right Kidney:  Length: 10.4 cm. Echogenicity within normal limits. No mass or hydronephrosis visualized. 6 mm nonobstructing calyceal stone noted .  Left Kidney:  Length: 10.7 cm. Echogenicity within normal limits. No mass or  hydronephrosis visualized.  Bladder:  Appears normal for degree of bladder distention.  IMPRESSION: 1. 6 mm nonobstructing calyceal stone right kidney. 2. Otherwise negative exam.   Electronically Signed   By: Maisie Fushomas  Register   On: 09/08/2014 07:08   Dg Chest Port 1 View  09/08/2014   CLINICAL DATA:  Central line placement  EXAM: PORTABLE CHEST - 1 VIEW  COMPARISON:  09/07/2014  FINDINGS: Interval placement of a left central venous catheter with tip over the cavoatrial junction. No pneumothorax. Cardiac enlargement. Increasing small bilateral pleural effusions and basilar atelectatic changes. No pneumothorax. Mediastinal contours appear intact.  IMPRESSION: Appliances appear in satisfactory position. Cardiac enlargement with increasing pleural effusions and basilar atelectatic changes since previous study.   Electronically Signed   By: Burman NievesWilliam  Stevens M.D.   On: 09/08/2014 05:44   Dg Chest Portable 1 View  09/22/2014   CLINICAL DATA:  Shortness of breath. Dry cough for 1 week. Congestion.  EXAM: PORTABLE CHEST - 1 VIEW  COMPARISON:  None.  FINDINGS: Indistinct obscuration left hemidiaphragm, probably from a left pleural effusion with passive atelectasis, although left lower lobe pneumonia is not excluded. The right lung appears clear. Mild enlargement of the cardiopericardial silhouette. Thoracic spondylosis.  IMPRESSION: 1. Hazy obscuration left hemidiaphragm -differential diagnostic considerations include left lower lobe atelectasis, left lower lobe pneumonia, or a layering pleural effusion. 2. Mild enlargement of the cardiopericardial silhouette.   Electronically Signed   By: Gaylyn RongWalter  Liebkemann M.D.   On: 09/23/2014 20:42    PHYSICAL EXAM- CVP 14-15  General: Chronically-ill appearing. In bed.  HEENT: normal Neck: supple. JVP to jaw.  Cor: PMI nondisplaced. Regular rate & rhythm. No rubs, gallops or murmurs. Lungs: Crackles dependently. Abdomen: soft, nontender, nondistended. No  hepatosplenomegaly. No bruits or masses. Good bowel sounds. Extremities: no cyanosis, clubbing, rash, R and LLE 3+ edema, LLE cooler. Partial thickness skin loss on posterior aspect.  Neuro: Drowsy  & orientedx3, blind. moves all 4 extremities w/o difficulty. Affect pleasant  TELEMETRY: Reviewed telemetry NSR 90-120s  ASSESSMENT AND PLAN: 1. Acute systolic CHF: EF 30-86%20-25%, suspect ischemic cardiomyopathy.  Getting high dose Lasix boluses per nephrology, now on milrinone 0.25. SBP trending down to the 80s.  Continue lasix 160 mg IV every 6 hours + metolazone 5 mg twice a day. Nephrology considering HD.  2. CAD: NSTEMI, troponin up to 5.86.  Not interventional candidate at this time with AKI.  No chest pain. Continue atorvastatin, not on ASA due to h/o anaphylaxis.  She is on heparin gtt and Plavix.  On coumadin. INR 1.84  3.NSVT: 09/07/2014.  No beta blocker due to shock.  4. DVT: RLE DVT on Heparin. Loading coumadin.  INR 1.8 5.Diabetes mellitus type II uncontrolled 6.Renal failure, suspect AKI on CKD. Creatinine trending up.  Nephrology considering HD. She is not sure she wants HD.  7.Blind. 8.Morbid obesity: BMI 40.8. 9. PAD: Seen by vascular, suspect multilevel arterial occlusive disease, not interventional candidate at this time. Vascular to follow up as outpatient.  10. ID: Completes 4 days of Zosyn for possible PNA. Now on augmentin day # 23  11. DNR  CLEGG,AMY NP-C  7:13 AM  Renal function and respiratory status much worse. Still markedly volume overload. Now hypotensive. Little else to offer.  Suspect Palliative measures are best at this point. Spoke with Dr. Molli Knock who agrees. Family discussions ongoing.  Daniel Bensimhon,MD 12:41 PM

## 2014-10-01 NOTE — Consult Note (Signed)
WOC: pt expired, no consult performed. Cammie Mcgeeawn Mykal Batiz MSN, RN, CWOCN, [place], CNS [phone]

## 2014-10-01 NOTE — Progress Notes (Signed)
Md notified of pt's c/o of not able to breath good and a cough. Pt states " nose stuffy".  Soft bp and pain issues.  Pt stated " I just want to be done like they do to dogs".  Will continue to monitor. Lenor CoffinGundrum, Natsha Guidry A

## 2014-10-01 NOTE — Progress Notes (Signed)
Subjective:    Patient was much more confused this morning and somnolent. She was difficult to arouse, and she has a difficult time answering questions. She had received fentanyl approximately an hour prior to her exam, but she has been progressively more somnolent over the past couple days.  Interval Events: -Blood pressures consistently in the 60s to 70s systolic this morning. -Co-ox down to 49.4%. -Weight down 4 pounds after net -973 mL yesterday, but creatinine elevated.  -Discussed the patient's current medical situation with her daughter, including her poor prognosis. She was initially planning to pursue dialysis and pressors as needed. -Patient had episode of coffee-ground emesis this morning. -PCCM consulted for transfer to the ICU to start pressors and initiate dialysis. When they saw the patient, the daughter had changed her mind, and she was interested in pursuing comfort care. -Comfort Care orders were placed, and patient passed away this afternoon.   Objective:    Vital Signs:   Temp:  [97.2 F (36.2 C)-98 F (36.7 C)] 97.2 F (36.2 C) (04/13 1146) Pulse Rate:  [97-106] 97 (04/13 0000) Resp:  [12-21] 12 (04/13 1146) BP: (65-114)/(40-72) 96/48 mmHg (04/13 1146) SpO2:  [92 %-98 %] 92 % (04/13 1146) Weight:  [218 lb 9.6 oz (99.156 kg)] 218 lb 9.6 oz (99.156 kg) (04/13 0500) Last BM Date: 09/30/2014  24-hour weight change: Weight change: -3 lb 12.8 oz (-1.724 kg)  Intake/Output:   Intake/Output Summary (Last 24 hours) at 09/16/2014 1153 Last data filed at 09/15/2014 1000  Gross per 24 hour  Intake 669.83 ml  Output   1576 ml  Net -906.17 ml      Physical Exam: General: Blind, somnolent, confused.  Lungs:  Minimally increased work of breathing, crackles bilaterally.   Heart: Regular rate, JVD. No murmurs, rubs, or gallops.  Abdomen:  BS normoactive. Soft, non-tender.  Extremities: 2+ pitting edema extending to the abdomen, slightly improved. Open wounds and  blisters on bilateral lower extremities very tender to palpation. Lower extremities cool.      Labs:  Basic Metabolic Panel:  Recent Labs Lab 09/07/14 1910  09/08/14 1540 09/09/14 0509 09/10/14 0410 09/11/14 0437 09/12/14 0510 09/27/2014 0500  NA  --   < >  --  129* 131* 131* 131* 130*  K  --   < >  --  4.6 4.6 4.1 4.0 4.7  CL  --   < >  --  95* 95* 94* 92* 92*  CO2  --   < >  --  GLUCOSE  --   < >  --  184* 140* 95 104* 101*  BUN  --   < >  --  56* 56* 56* 58* 61*  CREATININE  --   < >  --  2.67* 2.59* 2.66* 2.77* 3.23*  CALCIUM  --   < >  --  7.9* 7.9* 8.0* 8.0* 8.1*  MG 2.1  --   --   --   --   --   --   --   PHOS  --   --  4.7*  --   --   --   --   --   < > = values in this interval not displayed.  Liver Function Tests:  Recent Labs Lab 09-30-2014 1942  AST 33  ALT 28  ALKPHOS 139*  BILITOT 1.3*  PROT 5.3*  ALBUMIN 2.5*    CBC:  Recent Labs Lab 30-Sep-2014 1942 09/07/14 0307 09/09/14  [id number] 09/10/14 0410 09/11/14 0437 09/12/14 0510 2015/04/05 0500  WBC 20.6* 16.5* 14.9* 12.3* 10.8* 11.7* 13.4*  NEUTROABS 17.3* 14.5*  --   --   --   --   --   HGB 15.9* 13.6 12.7 12.5 11.8* 11.7* 12.5  HCT 47.0* 40.8 38.5 37.8 35.6* 35.6* 38.2  MCV 84.8 84.8 85.2 84.6 85.0 84.6 83.8  PLT 249 234 197 178 171 168 180    Cardiac Enzymes:  Recent Labs Lab 09/07/14 0320 09/07/14 0658 09/08/14 0500  TROPONINI 5.18* 5.76* 3.64*    CBG:  Recent Labs Lab 09/12/14 0728 09/12/14 1133 09/12/14 1630 09/12/14 2133 2015/04/05 0756  GLUCAP 95 89 94 100* 94   ABG    Component Value Date/Time   PHART 7.378 09/11/2014 1030   PCO2ART 38.2 09/11/2014 1030   PO2ART 65.0* 09/11/2014 1030   HCO3 22.6 09/11/2014 1030   TCO2 24 09/11/2014 1030   ACIDBASEDEF 2.0 09/11/2014 1030   O2SAT 49.4 02/26/2015 0428   Imaging: No results found.     Medications:    Infusions: . heparin 1,100 Units (2015/04/05 0726)  . milrinone 0.25 mcg/kg/min (09/12/14 2000)  .  norepinephrine (LEVOPHED) Adult infusion Stopped (09/09/14 0500)    Scheduled Medications: . antiseptic oral rinse  7 mL Mouth Rinse BID  . atorvastatin  80 mg Oral q1800  . clopidogrel  75 mg Oral Daily  . feeding supplement (NEPRO CARB STEADY)  237 mL Oral Q24H  . feeding supplement (PRO-STAT SUGAR FREE 64)  30 mL Oral BID WC  . furosemide  160 mg Intravenous Q6H  . insulin aspart  0-20 Units Subcutaneous TID WC  . insulin aspart  0-5 Units Subcutaneous QHS  . insulin detemir  5 Units Subcutaneous QHS  . ipratropium-albuterol  3 mL Nebulization BID  . metolazone  5 mg Oral BID  . multivitamin with minerals  1 tablet Oral Daily  . ondansetron (ZOFRAN) IV  4 mg Intravenous 4 times per day  . piperacillin-tazobactam (ZOSYN)  IV  2.25 g Intravenous 3 times per day  . sodium chloride  3 mL Intravenous Q12H  . Warfarin - Pharmacist Dosing Inpatient   Does not apply q1800    PRN Medications: acetaminophen, albuterol, dextromethorphan-guaiFENesin, oxyCODONE, oxymetazoline   Assessment/ Plan:    Principal Problem:   Acute combined systolic and diastolic heart failure Active Problems:   AKI (acute kidney injury)   Elevated troponin   Malnutrition   Elevated alkaline phosphatase level   Lactic acid acidosis   Hyperglycemia   Skin breakdown   NSTEMI (non-ST elevated myocardial infarction)   Cardiogenic shock  #Cardiogenic shock Good diuresis yesterday, but worsening status overall. Creatinine was rising, limiting further diuresis. Co-ox also significantly worse today with worsening blood pressure and mental status. Given her multiple medical problems she had a poor prognosis and Palliative Care had been consulted.  Discussed case with heart failure and nephrology, and she would have needed to go to the ICU for continued care.  Per PCCM, her daughter decided not to pursue additional aggressive care and comfort care orders were placed. The patient died shortly thereafter this  afternoon.  Length of Stay: 7 day(s)  Signed: Donavan FoilEverett J Cheryl Chay, MD  PGY-1, Internal Medicine Resident Pager: [phone] (7AM-5PM) 05-18-15, 11:53 AM

## 2014-10-01 NOTE — Progress Notes (Signed)
ANTICOAGULATION CONSULT NOTE - Follow Up Consult ANTIBIOTIC CONSULT NOTE - Follow Up Consult  Pharmacy Consult for Heparin and Coumadin; Zosyn Indication: DVT; r/o sepsis  Allergies  Allergen Reactions  . Bufferin Low Dose [Aspirin] Anaphylaxis    Swelling in face and cough, laryngospasm  . Iodine Nausea And Vomiting    Crab meat  . Neosporin Original [Bacitracin-Neomycin-Polymyxin] Rash    Bacitracin- makes her skin red and rashy  . Sulfites Other (See Comments)    Turns her face purple and feels hot    Patient Measurements: Height: 5\' 1"  (154.9 cm) Weight: 218 lb 9.6 oz (99.156 kg) IBW/kg (Calculated) : 47.8 Heparin Dosing Weight: 72kg  Vital Signs: Temp: 97.3 F (36.3 C) (04/13 0758) Temp Source: Oral (04/13 0758) BP: 76/40 mmHg (04/13 1000) Pulse Rate: 97 (04/13 0000)  Labs:  Recent Labs  09/11/14 0437 09/11/14 1330 09/12/14 0510 09/02/2014 0430 09/04/2014 0500  HGB 11.8*  --  11.7*  --  12.5  HCT 35.6*  --  35.6*  --  38.2  PLT 171  --  168  --  180  LABPROT  --   --  15.1  --  21.4*  INR  --   --  1.18  --  1.84*  HEPARINUNFRC 0.77* 0.67 0.63 1.09*  --   CREATININE 2.66*  --  2.77*  --  3.23*    Estimated Creatinine Clearance: 19 mL/min (by C-G formula based on Cr of 3.23).   Medications:  Heparin @ 1100 units/hr  Assessment: [age] yof with RLE DVT (per dopplers on 4/7) and NSTEMI (not a cath candidate) continues on IV heparin and coumadin day #3 overlap. Heparin level was high this morning so drip was held x 1 hour then resumed at a lower rate - follow up heparin level pending. INR has jumped 1.18 to 1.8 after two doses of coumadin 7.5mg . Will hold tonight's dose but still counts as an overlap day. CBC stable. No bleeding reported.  She also completed 6 days of antibiotics for wound infection/pneumonia and then was transitioned to augmentin on 4/11. Today her WBC is up and she is hypotensive, so antibiotics will be broadened back to zosyn to rule out sepsis.  Her renal function is worsening and renal is considering dialysis.  CTX  4/6>>4/8 Azithro  4/6>>4/8 Zosyn 4/8>>4/11, resume 4/13>> 4/12 Augmentin>>4/13 4/6 blood x 2>>ngtd 4/7 urine>> neg, final  Goal of Therapy:  INR 2-3 Heparin level 0.3-0.7 units/ml Monitor platelets by anticoagulation protocol: Yes   Plan:  1) Follow up 1530 heparin level 2) No coumadin tonight 3) INR in AM 4) Zosyn 2.25g IV q8 5) Follow up renal plans  Fredrik RiggerMarkle, Tina Atkins Sue 09/12/2014,11:02 AM

## 2014-10-01 NOTE — Discharge Summary (Signed)
Name: Tina Atkins MRN: [id number] DOB: 1950/06/24 [age] y.o.  Date of Admission: 09/29/2014  7:24 PM Date of Discharge: 09/28/2014 Attending Physician: Earl LagosNischal Narendra, MD  Discharge Diagnosis: Principal Problem:   Acute combined systolic and diastolic heart failure Active Problems:   AKI (acute kidney injury)   Elevated troponin   Malnutrition   Elevated alkaline phosphatase level   Lactic acid acidosis   Hyperglycemia   Skin breakdown   NSTEMI (non-ST elevated myocardial infarction)   Cardiogenic shock   Acute respiratory failure with hypoxemia   Altered mental status  Cause of death: Cardiogenic shock. Time of death: 1330.  Disposition and follow-up:   [name] was discharged from [facility] in expired condition.    Hospital Course:  #NSTEMI Her troponin was noted to be elevated on presentation with T-wave inversions in the inferior leads. She was started on heparin and cardiology was consulted.  Given her acute kidney disease and sepsis secondary to pneumonia, they did not recommend pursuing cardiac catheterization because she would not be a candidate for PCI or CABG. As a result, she was managed medically with heparin and started on Plavix given her aspirin intolerance. Her troponin was trended and peaked at 5.76. It was thought that her myocardial infarction in combination with her other medical problems likely led to her decompensation.  #Acute combined congestive heart failure The patient presented with shortness of breath and lower extremity edema with significant volume overload. Echocardiogram showed an ejection fraction of 20-25% along with grade 2 diastolic dysfunction. Heart failure was consulted and attempted to increase cardiac output with milrinone and dobutamine followed by Levophed with minimal improvement. She was ultimately transitioned to milrinone, Lasix, metolazone, and strict 1.2 L fluid restriction with minimal improvement of  her diuresis and blood pressure. Over the 2 days prior to her death, she became increasingly somnolent and her systolic blood pressure continued to drop. It was determined that she would need additional pressors to maintain her systolic pressure, and this along with her multiple medical problems contributing to a poor prognosis were discussed with the daughter. She ultimately decided that her mother would not like to pursue any additional life-prolonging measures, and she was transitioned to comfort care. She ultimately passed away from likely cardiogenic shock from her heart failure, which had worsened in the setting of a myocardial infarction.  #Acute on chronic kidney disease Her creatinine was 2.19 on admission, elevated from her baseline of 1.0 in 2014. However, she not had a recent BMP drawn to evaluate her kidney function. She initially had poor urine output and nephrology was consulted. They recommended increasing her Lasix dose, and her urine output eventually improved after she was started on pressors and her cardiac output improved. However, she continued to have poor diuresis overall, and the possibility of dialysis was brought up. The family was initially interested in this possibility, but changed their mind as the patient's condition worsened.  #Superficial femoral vein DVT  Due to the pain and swelling in her legs, there is concern for DVT. Lower extremity Dopplers were performed and noted a right superficial vein deep vein thrombosis. There is concern that she may have a coexistent pulmonary embolism, but evaluation was not pursued because it would not affect her management. She had already been started on heparin for her NSTEMI, so this was continued as she began warfarin prior to death.  #Sepsis secondary to community acquired pneumonia Her white blood cell count was noted to be elevated on  presentation, and she was found to have a left lower lobe infiltrate concerning for pneumonia. Her  urine strep pneumo antigen was positive. It is likely that sepsis secondary to pneumonia contributed to her hemodynamic shock on presentation.  She was initially treated with ceftriaxone and azithromycin, which was switched to Zosyn due to concern for possible infection of her lower extremities. Her white blood cell count improved during the hospitalization, and she was temporarily switched to Augmentin prior to switching back to Zosyn prior to her death.  #Lower extremity edema with blistering and chronic wounds She was noted to have 3+ pitting edema bilaterally with ulcerations and blistering. Wound care was consulted and provided recommendations, which were followed during the hospitalization.  #Peripheral vascular disease Her lower extremity were noted to be cold on exam with nonpalpable dorsalis pedis pulses. ABIs were ordered but could not be performed because pedal Doppler waveforms could not be identified. Vascular surgery was consulted and were suspicious of multilevel occlusion. Given her medical status, she would not have been a candidate for surgery, so no further workup was pursued.  #Type 2 diabetes She reported having prediabetes, but her hemoglobin A1c was noted to be elevated at 11.5 during the hospitalization. She was maintained on Levemir and sliding scale insulin with control of her blood sugars during the hospitalization.  #Malnutrition and failure to thrive The patient had been living by herself at home, and had been struggling to care for herself. She had relied on her daughter quite often, but her daughter was unable to meet her needs. Her albumin was noted to be low at 2.5, suggestive of malnutrition. Nutrition and social work were consulted, and they recommendations were followed during the hospitalization.  Signed: Adrian Blackwater Khilee Hendricksen, MD 2014-09-26, 4:15 PM

## 2014-10-01 NOTE — Progress Notes (Signed)
   January 10, 2015 1400  Clinical Encounter Type  Visited With Patient;Family;Health care provider  Visit Type Initial;Death  Spiritual Encounters  Spiritual Needs Grief support  Stress Factors  Family Stress Factors Loss   Chaplain was referred to patient via spiritual care consult. When chaplain arrived, patient had recently passed away. Patient's family was out in the 2H waiting area grieving. Chaplain asked family how they were doing. Patient's daughter explained that she was having a tough time but getting through it. Patient's daughter asked chaplain what needed to happen next. Chaplain encouraged patient's daughter, letting her know she can choose to be with the patient as much as she wishes and can also return home whenever she is ready. Patient's daughter said that her brother was flying in from [place] but would likely not want to see the patient, choosing to rather remember her alive. Patient's daughter had not decided on funeral arrangements yet so chaplain gave patient's daughter a patient placement card so that she can call the hospital with these arrangements tomorrow. Chaplain will provide follow up grief support as needed.  Abdulla Pooley, Tommi EmeryBlake R, Chaplain  2:27 PM

## 2014-10-01 NOTE — Consult Note (Signed)
PULMONARY / CRITICAL CARE MEDICINE   Name: Tina Atkins MRN: [id number] DOB: 10/01/1950    ADMISSION DATE:  09-11-14 CONSULTATION DATE:  09/05/2014  REFERRING MD :  Heide Spark  CHIEF COMPLAINT:  AMS  INITIAL PRESENTATION:  64 y.o. F brought to [facility] 4/6 with SOB.  Admitted for volume overload, heart failure, renal failure.  PCCM called for HD cath; however pt's daughter requesting comfort care.   STUDIES:  Renal US 4/8 >>> 6mm nonobstructing calyceal stone right kidney. Echo 4/7 >>> EF 20-25%, diffuse hypokinesis, grade 2 DD, mild AR, PAP 38.   SIGNIFICANT EVENTS: 4/6 - admit 4/7 - cards consult 4/8 - heart failure team consult, nephrology consult 4/13 - PCCM consult   HISTORY OF PRESENT ILLNESS:  Pt is encephalopathic; therefore, this HPI is obtained from chart review. Tina Atkins is a 64 y.o. F with PMH as outlined below.  She presented to [facility] 11, 2014 with non-productive cough, SOB, and peripheral edema x 1.5 weeks. Symptoms had progressed and worsened since onset.  Also developed weeping blisters on her legs.  Was unable to lie flat during the 1.5 weeks, had to sleep sitting up.  Had subjective fevers/chills along with one episode of diarrhea.  Denied any chest pain, wheezing, hemoptysis, N/V, abdominal pain.  She was found to have NSTEMI and NSVT.  Seen in consultation by cardiology and deemed not to be a cath candidate.  Started on heparin, plavix, atorvastatin (no aspirin due to anaphylaxis).  Also seen by heart failure team, started on milrinone and dobutamine for hypotension thought secondary to cardiogenic shock.  Vasopressors later changed to Levophed due to concerns for component of sepsis.  Seen in consultation by vascular surgery and deemed to not be a candidate for surgical intervention due to her multiple medical co-morbidities. She is to follow up as an outpatient.  Seen in consultation by nephrology for acute on chronic kidney disease.  Lasix increased for  additional diuresis; however, output remained less than optimal.  PCCM consulted 09/12/2014 for HD cath placement and possibly assistance with medical management.  Dr. Molli Knock had extensive discussion with pt's daughter and per daughter, pt was always very clear that she would never want to be resuscitated or have any heroic measures / rely on machines, etc.  Daughter requested that we do not insert HD cath and that we proceed with comfort care measures.  PAST MEDICAL HISTORY :   has a past medical history of Hypertension; Diabetes mellitus without complication; Blind; DES exposure in utero, unknown; Fracture of left humerus; Glaucoma; and Cataract.  has past surgical history that includes Cesarean section (1987) and Tonsillectomy (1967). Prior to Admission medications   Medication Sig Start Date End Date Taking? Authorizing Provider  b complex vitamins tablet Take 1 tablet by mouth 2 (two) times a week.   Yes Historical Provider, MD  guaiFENesin (MUCINEX) 600 MG 12 hr tablet Take 600 mg by mouth 2 (two) times daily as needed.   Yes Historical Provider, MD  ibuprofen (ADVIL,MOTRIN) 200 MG tablet Take 400 mg by mouth every 6 (six) hours as needed for moderate pain.   Yes Historical Provider, MD  naproxen sodium (ANAPROX) 220 MG tablet Take 220 mg by mouth 2 (two) times daily as needed (FOR PAIN).   Yes Historical Provider, MD  PE-DM-APAP & Doxylamin-DM-APAP (LIQUID) MISC Take 2 capsules by mouth 2 (two) times daily as needed (FOR COLD).   Yes Historical Provider, MD   Allergies  Allergen Reactions  . Bufferin Low  Dose [Aspirin] Anaphylaxis    Swelling in face and cough, laryngospasm  . Iodine Nausea And Vomiting    Crab meat  . Neosporin Original [Bacitracin-Neomycin-Polymyxin] Rash    Bacitracin- makes her skin red and rashy  . Sulfites Other (See Comments)    Turns her face purple and feels hot    FAMILY HISTORY:  Family History  Problem Relation Age of Onset  . Heart disease Father   .  Diabetes Father     SOCIAL HISTORY:  reports that she has never smoked. She does not have any smokeless tobacco history on file. She reports that she does not drink alcohol. Her drug history is not on file.  REVIEW OF SYSTEMS:  Unable to obtain as pt is encephalopathic.  SUBJECTIVE:   VITAL SIGNS: Temp:  [97.2 F (36.2 C)-98 F (36.7 C)] 97.2 F (36.2 C) (04/13 1146) Pulse Rate:  [97-106] 97 (04/13 0000) Resp:  [12-21] 12 (04/13 1146) BP: (65-114)/(40-72) 96/48 mmHg (04/13 1146) SpO2:  [92 %-98 %] 92 % (04/13 1146) Weight:  [99.156 kg (218 lb 9.6 oz)] 99.156 kg (218 lb 9.6 oz) (04/13 0500) HEMODYNAMICS: CVP:  [14 mmHg-23 mmHg] 14 mmHg VENTILATOR SETTINGS:   INTAKE / OUTPUT: Intake/Output      04/12 0701 - 04/13 0700 04/13 0701 - 04/14 0700   I.V. (mL/kg) 464.2 (4.7) 94.7 (1)   IV Piggyback 132    Total Intake(mL/kg) 596.2 (6) 94.7 (1)   Urine (mL/kg/hr) 1570 (0.7) 6 (0)   Stool     Total Output 1570 6   Net -973.8 +88.7          PHYSICAL EXAMINATION: General: Chronically ill appearing, somnolent. Neuro: Somnolent, arouses to voice. HEENT: [place]/AT. PERRL, sclerae anicteric. Cardiovascular: RRR, no M/R/G.  Lungs: Respirations shallow and slow.  Crackles bilaterally. Abdomen: BS x 4, soft, NT/ND.  Musculoskeletal: No gross deformities, 2+ edema to hips.  Skin: Intact, cool, no rashes.  LABS:  CBC  Recent Labs Lab 09/11/14 0437 09/12/14 0510 09/27/2014 0500  WBC 10.8* 11.7* 13.4*  HGB 11.8* 11.7* 12.5  HCT 35.6* 35.6* 38.2  PLT 171 168 180   Coag's  Recent Labs Lab 2014-09-16 1942 09/12/14 0510 09/10/2014 0500  INR 1.19 1.18 1.84*   BMET  Recent Labs Lab 09/11/14 0437 09/12/14 0510 09/07/2014 0500  NA 131* 131* 130*  K 4.1 4.0 4.7  CL 94* 92* 92*  CO2 BUN 56* 58* 61*  CREATININE 2.66* 2.77* 3.23*  GLUCOSE 95 104* 101*   Electrolytes  Recent Labs Lab 09/07/14 1910  09/08/14 1540  09/11/14 0437 09/12/14 0510 09/18/2014 0500   CALCIUM  --   < >  --   < > 8.0* 8.0* 8.1*  MG 2.1  --   --   --   --   --   --   PHOS  --   --  4.7*  --   --   --   --   < > = values in this interval not displayed. Sepsis Markers  Recent Labs Lab 09/07/14 0307 09/07/14 0658 09/11/14 1015 09/02/2014 1030  LATICACIDVEN 2.8*  --  1.3 4.7*  PROCALCITON  --  0.47  --   --    ABG  Recent Labs Lab Sep 16, 2014 2050 09/11/14 1030  PHART 7.391 7.378  PCO2ART 31.3* 38.2  PO2ART 159.0* 65.0*   Liver Enzymes  Recent Labs Lab 09-16-14 1942  AST 33  ALT 28  ALKPHOS 139*  BILITOT 1.3*  ALBUMIN 2.5*   Cardiac Enzymes  Recent Labs Lab 09/07/14 0320 09/07/14 0658 09/08/14 0500  TROPONINI 5.18* 5.76* 3.64*   Glucose  Recent Labs Lab 09/11/14 2107 09/12/14 0728 09/12/14 1133 09/12/14 1630 09/12/14 2133 09/07/2014 0756  GLUCAP 211* 95 89 94 100* 94    Imaging No results found.  ASSESSMENT / PLAN:  Acute respiratory failure  Acute combined CHF - on milrinone. EF 20-25%. Significant volume overload despite aggressive diuresis.  Hypotension  NSTEMI - troponin 5.8, not candidate for intervention  RLE DVT  ?HCAP  Acute on CKD 3/4 DM PVD  Recs: Pt deteriorating this am, now with coffee ground emesis, worsening hypotension. Persistent volume overload in setting CHF c/b acute on CKD. Discussed with patients daughter, pt is DNR. Daughter does not want any further aggressive care. 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 Pts daughter wishes to focus on comfort and ensure that her mother "passes peacefully". Will place comfort care orders and d/w teaching service.   No need to tx ICU. PCCM will sign off, please call back if we can be of any further assistance.   Rutherford Guysahul Desai, PA - [facility] [place] [facility] Pager: [phone]  or [phone]  Called to place HD catheter  and transfer to the ICU for levophed given hypotension.  Spoke with cardiology and agreed after discussion that comfort care is more appropriate.  When I evaluated the patient she was on 100% NRB and unresponsive.  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  Given the fact that patient is in respiratory failure and does not want to be intubated it would make little sense to place an HD catheter for CVVH and start levophed as respiratory failure is what is likely going to end her life.  Spoke with daughter again and decided that patient would want to be comfortable in this situation.  Therefore, will make DNR, will not start levophed, will not place a dialysis catheter and will not transfer to the ICU.  To remain on IMTS and PCCM will sign off, please call back if needed.  The patient is critically ill with multiple organ systems failure and requires high complexity decision making for assessment and support, frequent evaluation and titration of therapies, application of advanced monitoring technologies and extensive interpretation of multiple databases.   Critical Care Time devoted to patient care services described in this note is  45  Minutes. This time reflects time of care of this signee Dr Koren BoundWesam Yacoub. This critical care time does not reflect procedure time, or teaching time or supervisory time of PA/NP/Med student/Med Resident etc but could involve care discussion time.  Alyson ReedyWesam G. Yacoub, M.D. [facility]. Pager: [phone]. After hours pager: [phone].  09/22/2014, 12:56 PM

## 2014-10-01 NOTE — Progress Notes (Signed)
Called [facility].  

## 2014-10-01 NOTE — Progress Notes (Signed)
Pts. Respirations ceased and asystole on monitor. Myself and Bronson CurbHannah Padgett RN confirmed expiration; absence of heart tones and respirations timed for one minute. No pupillary reactions. Daughter present.

## 2014-10-01 NOTE — Progress Notes (Signed)
ANTICOAGULATION CONSULT NOTE - Follow Up Consult  Pharmacy Consult for heparin Indication: DVT   Labs:  Recent Labs  09/11/14 0437 09/11/14 1330 09/12/14 0510 09/16/2014 0430 09/23/2014 0500  HGB 11.8*  --  11.7*  --  12.5  HCT 35.6*  --  35.6*  --  38.2  PLT 171  --  168  --  180  LABPROT  --   --  15.1  --  21.4*  INR  --   --  1.18  --  1.84*  HEPARINUNFRC 0.77* 0.67 0.63 1.09*  --   CREATININE 2.66*  --  2.77*  --  3.23*     Assessment: 64yo female now supratherapeutic on heparin after two levels at upper end of goal.  Goal of Therapy:  Heparin level 0.3-0.7 units/ml   Plan:  Will hold heparin gtt x6830min then restart heparin at lower rate of 1100 units/hr and check level in 8hr.  Vernard GamblesVeronda Jaydee Ingman, PharmD, BCPS  09/09/2014,5:59 AM

## 2014-10-01 NOTE — Progress Notes (Signed)
Assessment: 1. Acute vs Acute on CKD 3/4. 2. Hypotension sec cardiomyopathy. Now off pressors 3. PVD 4. DM 5. 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 DVT  Plan: 1. She is not making significant progress and may benefit from CVVHD.  Hopefully if BP and renal fct improves we can stop dialysis or transition to IHD if fct does not improve.  I spoke with Dr. Heide SparkNarendra and agree with difficult circumstance and need for palliative care to begin discussions about care as well  Subjective: Interval History: c/o nausea and vomitied coffee grounds this AM  Objective: Vital signs in last 24 hours: Temp:  [97.3 F (36.3 C)-98 F (36.7 C)] 97.3 F (36.3 C) (04/13 0758) Pulse Rate:  [97-106] 97 (04/13 0000) Resp:  [12-21] 12 (04/13 1000) BP: (65-114)/(40-72) 76/40 mmHg (04/13 1000) SpO2:  [92 %-98 %] 92 % (04/13 0800) Weight:  [99.156 kg (218 lb 9.6 oz)] 99.156 kg (218 lb 9.6 oz) (04/13 0500) Weight change: -1.724 kg (-3 lb 12.8 oz)  Intake/Output from previous day: 04/12 0701 - 04/13 0700 In: 596.2 [I.V.:464.2; IV Piggyback:132] Out: 1570 [Urine:1570] Intake/Output this shift: Total I/O In: 94.7 [I.V.:94.7] Out: 6 [Urine:6]  General appearance: ill appearing Extremities: edema 2-3+  Lab Results:  Recent Labs  09/12/14 0510 2015-03-12 0500  WBC 11.7* 13.4*  HGB 11.7* 12.5  HCT 35.6* 38.2  PLT 168 180   BMET:  Recent Labs  09/12/14 0510 2015-03-12 0500  NA 131* 130*  K 4.0 4.7  CL 92* 92*  CO2 27 21  GLUCOSE 104* 101*  BUN 58* 61*  CREATININE 2.77* 3.23*  CALCIUM 8.0* 8.1*   No results for input(s): PTH in the last 72 hours. Iron Studies: No results for input(s): IRON, TIBC, TRANSFERRIN, FERRITIN in the last 72 hours. Studies/Results: No results found.  Scheduled: . antiseptic oral rinse  7 mL Mouth Rinse BID  . atorvastatin  80 mg Oral q1800  . clopidogrel  75 mg Oral Daily  . feeding supplement (NEPRO CARB STEADY)  237 mL Oral Q24H  .  feeding supplement (PRO-STAT SUGAR FREE 64)  30 mL Oral BID WC  . furosemide  160 mg Intravenous Q6H  . insulin aspart  0-20 Units Subcutaneous TID WC  . insulin aspart  0-5 Units Subcutaneous QHS  . insulin detemir  5 Units Subcutaneous QHS  . ipratropium-albuterol  3 mL Nebulization BID  . metolazone  5 mg Oral BID  . multivitamin with minerals  1 tablet Oral Daily  . ondansetron (ZOFRAN) IV  4 mg Intravenous 4 times per day  . piperacillin-tazobactam (ZOSYN)  IV  2.25 g Intravenous 3 times per day  . sodium chloride  3 mL Intravenous Q12H  . Warfarin - Pharmacist Dosing Inpatient   Does not apply q1800    LOS: 7 days   Inez Stantz C 12/14/14,11:33 AM

## 2014-10-01 DEATH — deceased

## 2014-11-01 ENCOUNTER — Encounter: Payer: Medicaid Other | Admitting: Vascular Surgery

## 2014-11-01 ENCOUNTER — Encounter (HOSPITAL_COMMUNITY): Payer: Medicaid Other

## 2016-02-26 IMAGING — US US RENAL
1 series · 14 of 20 positions shown · non-contrast
Comparison: None.

CLINICAL DATA: Renal failure.  Hypertension.  Diabetes.

EXAM:
RENAL/URINARY TRACT ULTRASOUND COMPLETE

[Series 1: us renal · 0.31mm/px · 14 of 20 slices shown]
[im 1/20]
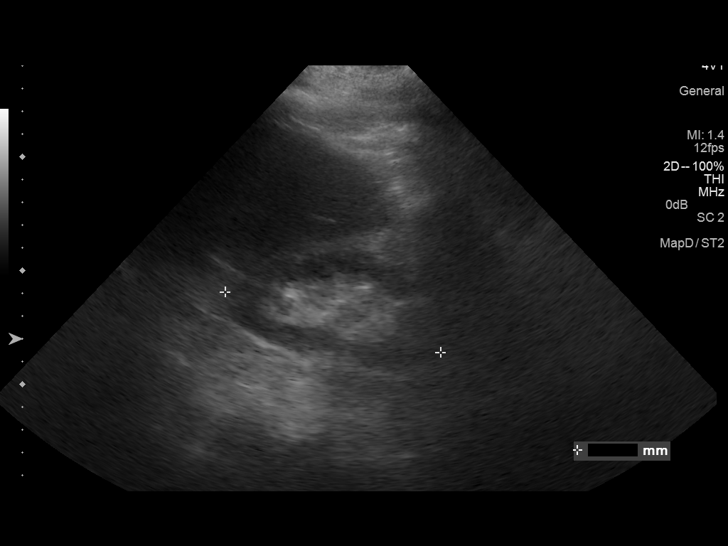
[im 3/20]
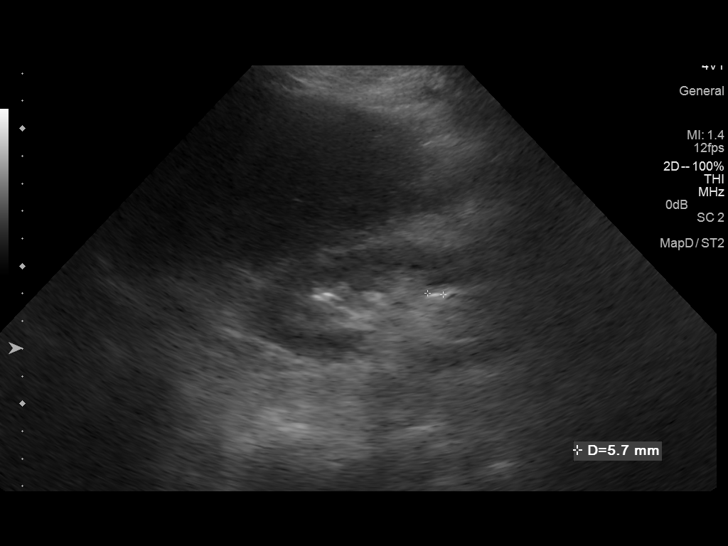
[im 4/20]
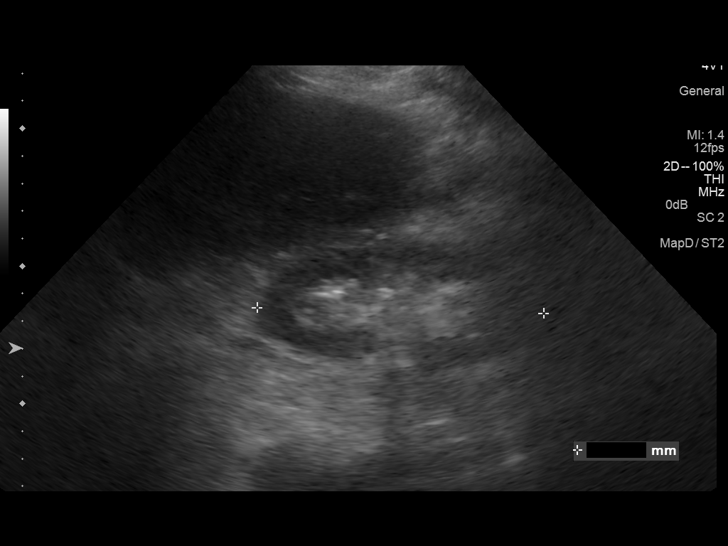
[im 6/20]
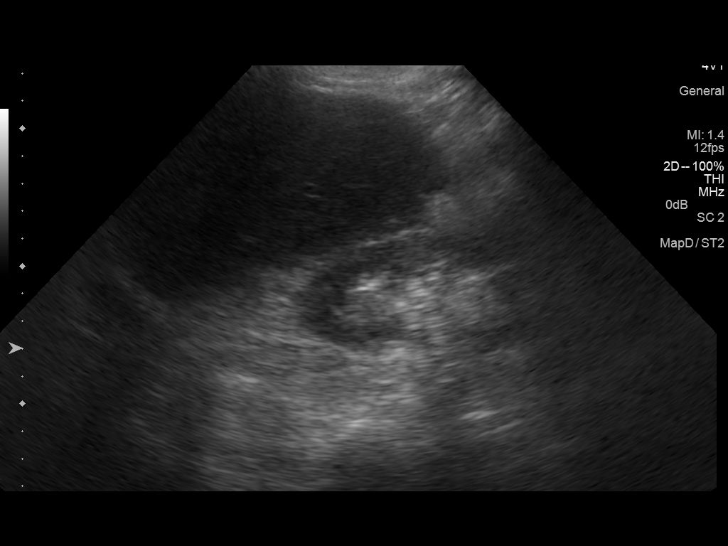
[im 7/20]
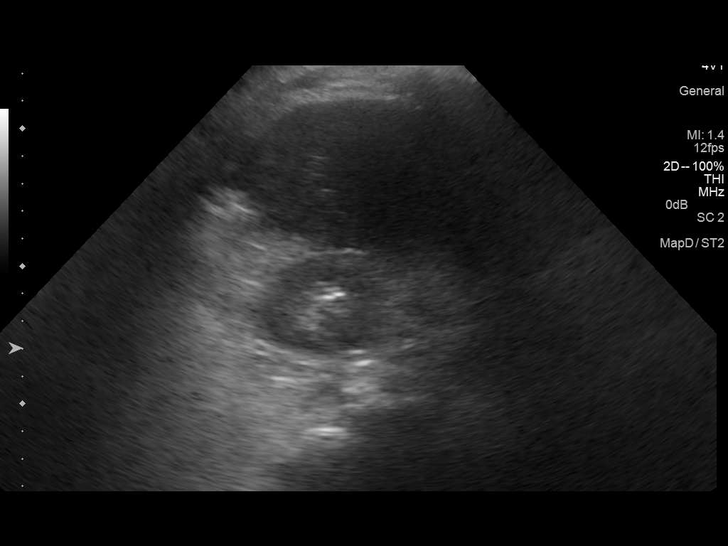
[im 8/20]
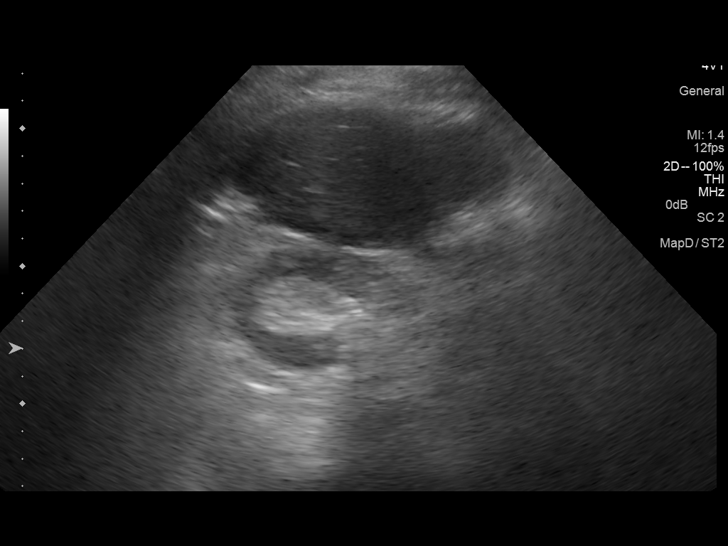
[im 10/20]
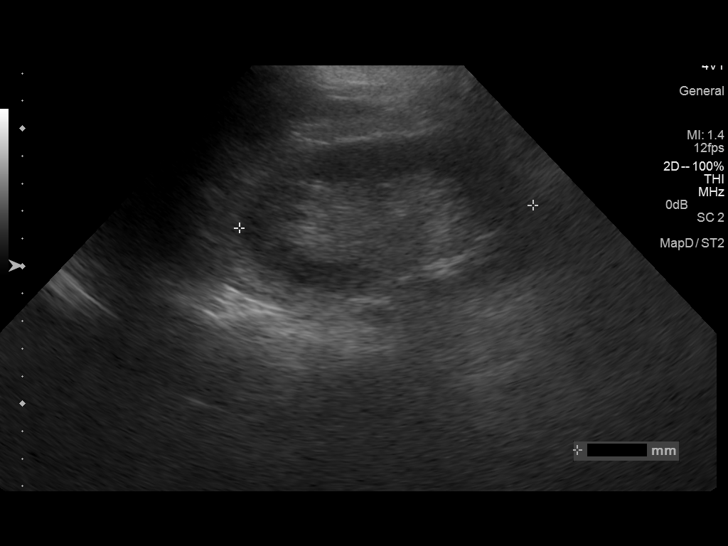
[im 11/20]
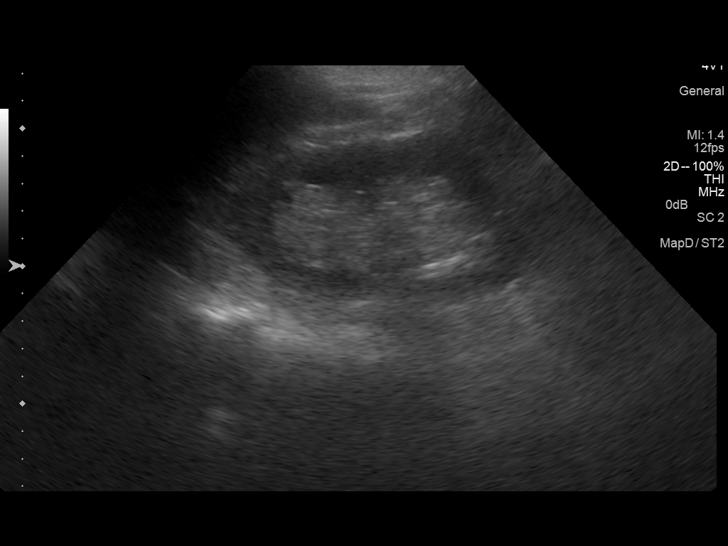
[im 13/20]
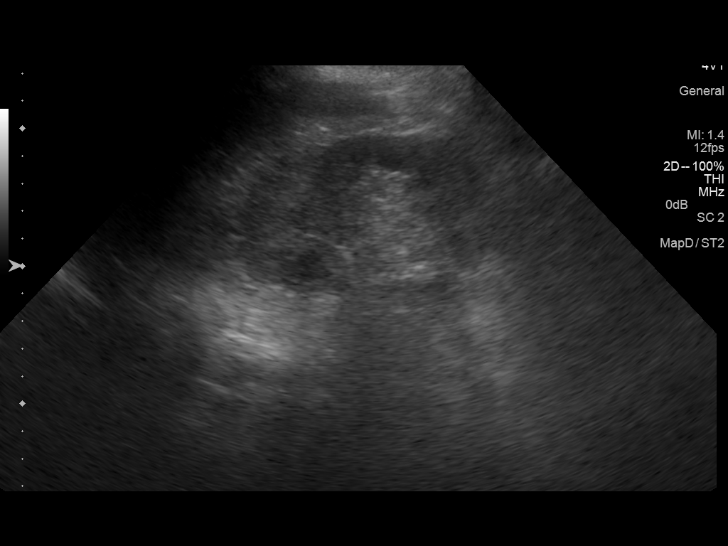
[im 14/20]
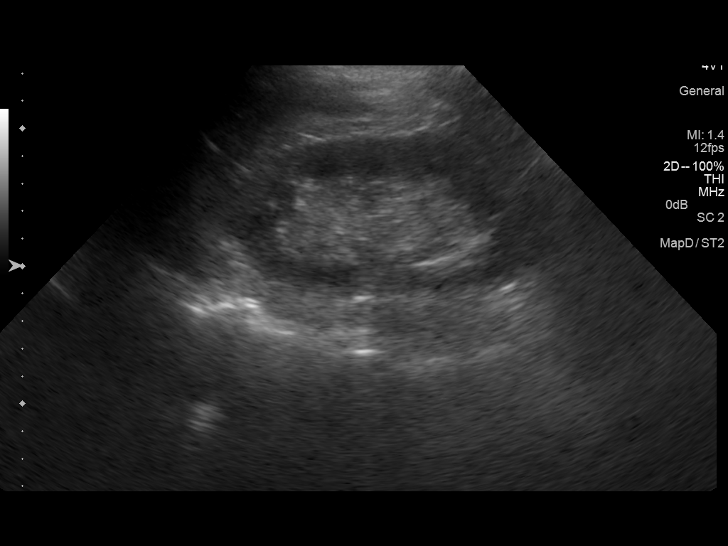
[im 16/20]
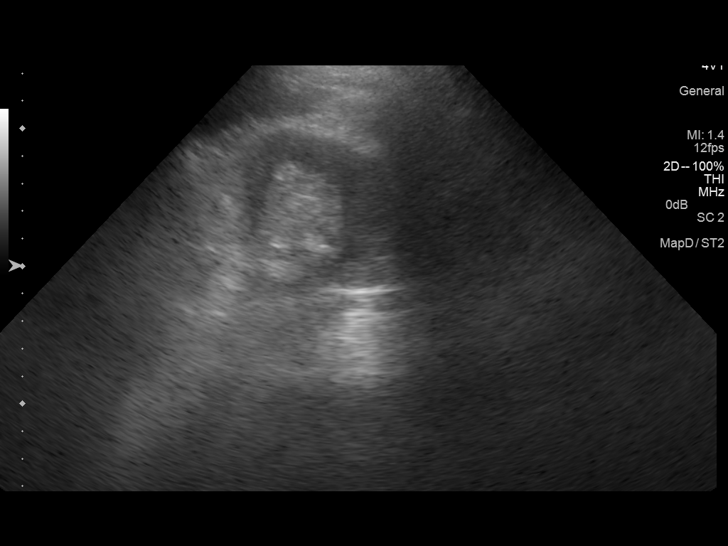
[im 17/20]
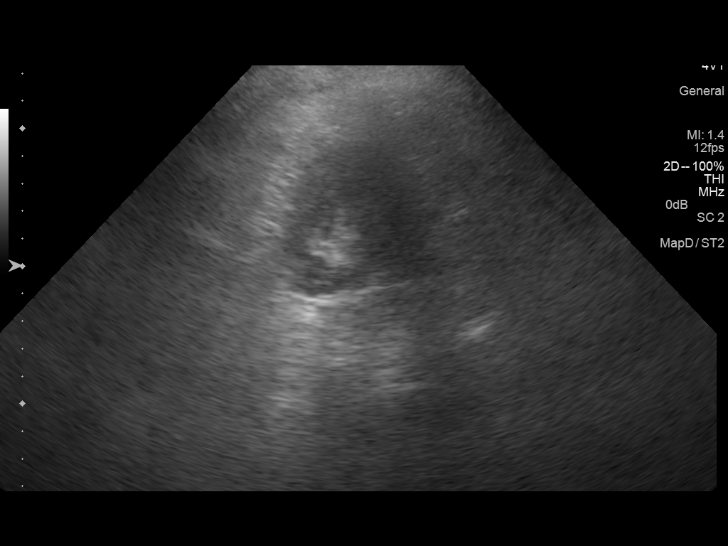
[im 18/20]
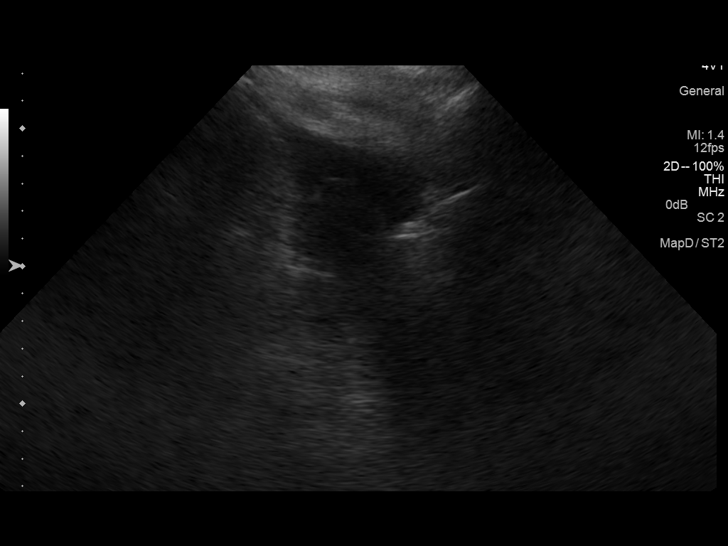
[im 20/20]
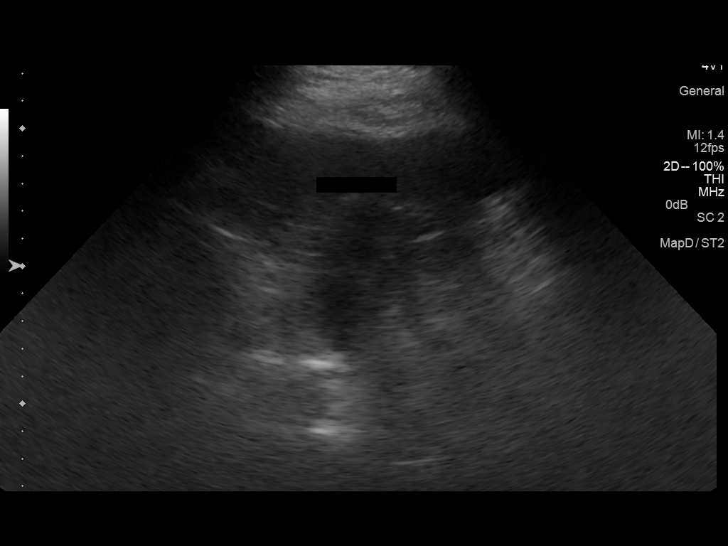

[14 of 20 positions shown; findings below may reference images not displayed]

FINDINGS: Right Kidney:

Length: 10.4 cm. Echogenicity within normal limits. No mass or
hydronephrosis visualized. 6 mm nonobstructing calyceal stone noted
.

Left Kidney:

Length: 10.7 cm. Echogenicity within normal limits. No mass or
hydronephrosis visualized.

Bladder:

Appears normal for degree of bladder distention.
IMPRESSION: 1. 6 mm nonobstructing calyceal stone right kidney.
2. Otherwise negative exam.
